# Patient Record
Sex: Male | Born: 1955 | Race: White | Hispanic: No | Marital: Married | Smoking: Never smoker
Health system: Southern US, Community
[De-identification: ages and names within clinical notes are randomized; demographics above are authoritative.]

## PROBLEM LIST (undated history)

## (undated) DIAGNOSIS — R011 Cardiac murmur, unspecified: Secondary | ICD-10-CM

## (undated) DIAGNOSIS — F32A Depression, unspecified: Secondary | ICD-10-CM

## (undated) DIAGNOSIS — F329 Major depressive disorder, single episode, unspecified: Secondary | ICD-10-CM

## (undated) DIAGNOSIS — I341 Nonrheumatic mitral (valve) prolapse: Secondary | ICD-10-CM

## (undated) DIAGNOSIS — F419 Anxiety disorder, unspecified: Secondary | ICD-10-CM

## (undated) DIAGNOSIS — G43909 Migraine, unspecified, not intractable, without status migrainosus: Secondary | ICD-10-CM

## (undated) DIAGNOSIS — A770 Spotted fever due to Rickettsia rickettsii: Secondary | ICD-10-CM

## (undated) HISTORY — DX: Anxiety disorder, unspecified: F41.9

## (undated) HISTORY — PX: HERNIA REPAIR: SHX51

## (undated) HISTORY — PX: VASECTOMY: SHX75

## (undated) HISTORY — DX: Depression, unspecified: F32.A

## (undated) HISTORY — DX: Major depressive disorder, single episode, unspecified: F32.9

## (undated) HISTORY — PX: WRIST SURGERY: SHX841

---

## 2001-10-29 ENCOUNTER — Encounter: Payer: Self-pay | Admitting: Cardiology

## 2001-10-29 ENCOUNTER — Inpatient Hospital Stay (HOSPITAL_COMMUNITY): Admission: AD | Admit: 2001-10-29 | Discharge: 2001-11-01 | Payer: Self-pay | Admitting: Cardiology

## 2007-01-28 ENCOUNTER — Inpatient Hospital Stay (HOSPITAL_COMMUNITY): Admission: AD | Admit: 2007-01-28 | Discharge: 2007-02-01 | Payer: Self-pay | Admitting: Psychiatry

## 2007-01-28 ENCOUNTER — Ambulatory Visit: Payer: Self-pay | Admitting: Psychiatry

## 2007-01-28 ENCOUNTER — Emergency Department (HOSPITAL_COMMUNITY): Admission: EM | Admit: 2007-01-28 | Discharge: 2007-01-28 | Payer: Self-pay | Admitting: Emergency Medicine

## 2007-02-02 ENCOUNTER — Other Ambulatory Visit (HOSPITAL_COMMUNITY): Admission: RE | Admit: 2007-02-02 | Discharge: 2007-02-23 | Payer: Self-pay | Admitting: Psychiatry

## 2007-03-23 ENCOUNTER — Ambulatory Visit (HOSPITAL_COMMUNITY): Payer: Self-pay | Admitting: Psychiatry

## 2007-04-29 ENCOUNTER — Ambulatory Visit (HOSPITAL_COMMUNITY): Payer: Self-pay | Admitting: Psychiatry

## 2007-07-01 ENCOUNTER — Ambulatory Visit (HOSPITAL_COMMUNITY): Payer: Self-pay | Admitting: Psychiatry

## 2011-02-13 NOTE — Discharge Summary (Signed)
NAMEJONCARLO, Leonard Holland                  ACCOUNT NO.:  [id number]   MEDICAL RECORD NO.:  [id number]          PATIENT TYPE:  IPS   LOCATION:  0305                          FACILITY:  [facility]   PHYSICIAN:  Anselm Jungling, MD  DATE OF BIRTH:  05/08/1956   DATE OF ADMISSION:  01/28/2007  DATE OF DISCHARGE:  02/01/2007                               DISCHARGE SUMMARY   IDENTIFYING DATA AND REASON FOR ADMISSION:  This was an inpatient  psychiatric admission for Leonard Holland, a 55 year old married white male.  He had presented to the [facility] Emergency Department the day prior  to admission complaining of persistent headache.  He also reported that  he had been experiencing a lot of stress at work and home, and had left  home 5 days prior.  He described driving to [place], [place] in his  car, and while in [place] in a motel room attempted to end his life  by drinking alcohol and ingesting aspirin.  He woke up the next day,  disappointed that he was still alive, and following this, drove himself  to [place], and claims to have attempted several times to kill myself  there by jumping off cliffs while hiking in the woods.  He stated he  never actually jumped and was unable to get himself to do so.  He then  made one further suicide attempt by ingesting alcohol and aspirin, and  waking up freezing in the woods.  At this point he decided to return  home.  He then called his place of employment, and his Programmer, systems came and brought him back home.  Following his emergency room  evaluation, he was transferred to the inpatient psychiatric service.  Please refer to the admission note for further details pertaining to the  symptoms, circumstances and history that led to his hospitalization.  He  was given initial Axis I diagnoses of mood disorder NOS.   MEDICAL AND LABORATORY:  The patient had a history of mitral valve  prolapse, a history of syncope, chronic headache, which appeared to  be  rebound in nature.  He was continued on his usual aspirin 325 mg daily.  He was also given a brief trial of  Elavil 25 mg at bedtime, for  headache cessation.   HOSPITAL COURSE:  The patient was admitted to the adult inpatient  psychiatric service.  He was initially evaluated by Dr. Lolly Mustache.  Dr.  Lolly Mustache described the patient as an alert and oriented gentleman who was  appropriately groomed, and dressed, and nourished.  His speech and  thought processes were within normal limits.  His mood and affect were  felt to be appropriate to the situation.  He indicated in the initial  interview that he was not sure whether or not he was still suicidal.   The patient was treated with the antidepressant Cymbalta, at a dose of  60 mg daily.  Xanax 0.25 mg was utilized at bedtime to assist with sleep  and anxiety.   On the fourth hospital day, the undersigned assumed care of  the patient.  The patient told me I found out that suicide is not an option.  He  reported that his headache was gone, after he resumed consumption of a  mild amounts of caffeine, by drinking cola.  He stated I feel 100%  better.   We discussed the possibility of a family meeting involving his wife, and  he told me that he did not feel it was necessary.  She visits every  day.  The patient indicated that he felt he was tolerating Cymbalta.  He discussed his aftercare plan of following up with his usual  therapist, Ms. Salinger, following discharge.  He indicated that he had  been in contact with his employer was supportive and who was willing to  make adjustments.   We discussed the possibility of the patient being discharged, with a  plan to continue in the [facility] intensive outpatient program.  On the  following day, the patient appeared to remain stable and appeared  appropriate for step-down to IOP.   AFTERCARE:  The patient was to report to the IOP program on the morning  of Feb 02, 2007.   DISCHARGE  MEDICATIONS:  Aspirin 325 mg daily, and Cymbalta 60 mg daily.  The patient was not recommended to continue on Elavil.   DISCHARGE DIAGNOSES:  AXIS I:  Major depressive disorder, recurrent  without psychotic features.  AXIS II:  Deferred.  AXIS III:  History of mitral valve prolapse.  AXIS IV:  Stressors severe.  AXIS V:  Global assessment of functioning on discharge 55.      Anselm Jungling, MD  Electronically Signed     SPB/MEDQ  D:  02/07/2007  T:  02/08/2007  Job:  [id number]

## 2011-02-13 NOTE — H&P (Signed)
Leonard Holland, SPINK NO.:  [id number]   MEDICAL RECORD NO.:  [id number]          PATIENT TYPE:  IPS   LOCATION:  0305                          FACILITY:  [facility]   PHYSICIAN:  Anselm Jungling, MD  DATE OF BIRTH:  [birth year]   DATE OF ADMISSION:  01/28/2007  DATE OF DISCHARGE:                       PSYCHIATRIC ADMISSION ASSESSMENT   IDENTIFYING INFORMATION:  This is a voluntary admission to the services  of Dr. Geralyn Flash.  This is a 55 year old married white male.  Apparently, he presented to the [facility] ED yesterday complaining of  headache for five days despite taking aspirin q.2h.  He also reported  that he left home five days ago.  He had been experiencing a lot of  stress between his employment and home.  He felt that the only thing he  could do to take care of himself was to just get in his car and drive.  He states he drove straight to [place], [place] and while they were  in a hotel room, he attempted to end life by drinking alcohol and  ingesting aspirin.  He woke up the next day.  He was disappointed he was  still alive.  He then drove straight to [place] and, while there, he  claims to have attempted several times to kill himself by jumping off  the cliffs while hiking in the woods.  He states he never actually  jumped but stood looking down.  After attempting once again by ingesting  alcohol and aspirin and waking up freezing in the woods, he decided to  go back home.  He then called his place of employment and his HR manager  and they came and brought him back home.  He denies being suicidal or  homicidal at present and wants to get rid of his headache.   PAST PSYCHIATRIC HISTORY:  He was treated as an outpatient by Dr.  Marlyne Beards February of 2006 to December of 2007.  Apparently, he has tried  a variety of antidepressant, antianxiety agents.  The one he took the  longest was Wellbutrin.  However, after Dr. Marlyne Beards left private  practice, he did not have his medication renewed.  He also can name  Paxil and Effexor which were not helpful.  He has seen a therapist, a  Dr. Everlene Other, for several years.   SOCIAL HISTORY:  He has three years of collage and an associate's degree  from [employer].  He has been married once.  He has a 62 year old daughter.  This is his ninth year in his current employment.  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   FAMILY HISTORY:  He denies.   ALCOHOL/DRUG HISTORY:  He denies.   PRIMARY CARE PHYSICIAN:  Dr. Gordy Savers.   MEDICAL PROBLEMS:  He does have mitral valve prolapse.  He had a workup  in February of 2003 for syncope.  At that time, he did have an abnormal  EKG but it was nonspecific.  He also has chronic headache and is status  post  repair of a right inguinal hernia.   MEDICATIONS:  He was prescribed Xanax 0.25 mg, to take at h.s. by Dr.  Marlyne Beards.  He just takes this p.r.n. and hence that explains why he  still has some.   ALLERGIES:  PENICILLIN (he reports having had a seizure).   POSITIVE PHYSICAL FINDINGS:  He was medically cleared in the ED at  [facility].  He had no remarkable physical findings other than his UDS  being positive for benzodiazepines.  His vital signs show he is 67  inches tall.  He weighs 175 pounds.  Temperature is 97.3, blood pressure  125/73 to 167/75, pulse 80-91, respirations 18.  He does have an old  well-healed right inguinal hernia scar and he is reporting tinnitus.  Given the amount of aspirin he has been taking, however, that would not  be uncommon.   MENTAL STATUS EXAM:  Today, he is alert and oriented x3.  He is  appropriately groomed, dressed and nourished.  His speech is a normal  rate, rhythm and tone.  His mood is appropriate to the situation.  His  affect has a normal range.  His thought processes are somewhat focused  on himself and narcissistic.  Judgment and insight are intact.   Concentration and memory are intact.  Intelligence is at least average.  He is not sure that he is still suicidal.  He is definitely not  homicidal and he does not have auditory or visual hallucinations.   DIAGNOSES:  AXIS I:  Mood disorder not otherwise specified.  AXIS II:  Deferred.  AXIS III:  Mitral valve prolapse, history for syncope, chronic headache,  currently rebound in nature.  AXIS IV:  Moderate.  AXIS V:  34.   PLAN:  To admit for safety and stabilization.  To start and appropriate  antidepressant, anti-anxiety agent.  Toward that end, we will start  Cymbalta.  We will also give him some Elavil 25 mg at h.s. to help with  his chronic headache, etc.   ESTIMATED LENGTH OF STAY:  Four to five days.  We will have to help line  up an outside psychiatrist for him.      Mickie Leonarda Salon, P.A.-C.      Anselm Jungling, MD  Electronically Signed    MD/MEDQ  D:  01/29/2007  T:  01/29/2007  Job:  [id number]

## 2011-02-13 NOTE — Cardiovascular Report (Signed)
[place]  Patient:    Leonard Holland, Leonard Holland Visit Number: [id number] MRN: [id number]          Service Type: MED Location: 2000 2005 01 Attending Physician:  Pamella Pert Dictated by:   Lenise Herald, M.D. Proc. Date: 10/31/01 Admit Date:  10/29/2001   CC:         Delrae Rend, M.D.  Aura Dials, M.D.   Cardiac Catheterization  PROCEDURE:  Heads up tilt table testing.  CARDIOLOGIST:  Lenise Herald, M.D.  COMPLICATIONS:  None.  INDICATIONS:  Mr. Gorniak is a 55 year old male, patient of Dr. Jeanella Cara and Dr. Everlene Other with a history of recurrent syncope.  The patient did have a true syncopal episode with right facial trauma.  He is now referred for tilt table testing to rule out autonomic dysfunction.  DESCRIPTION OF PROCEDURE:  After obtaining informed consent, the patient was brought to the catheterization lab in a fasting state.  The patient was monitored in a supine position with a resting blood pressure of 130/79, resting heart rate 71.  The patient was then tilted to 7 degrees heads up position and was monitored in a hemodynamic fashion.  At approximately 9 minutes into the procedure, the patient became diaphoretic and felt slightly short of breath.  His blood pressure dropped to 60/palpable with a heart rate in the mid 60s.  The patient did have complete loss of consciousness with palpable blood pressure of 58.  He was again returned to a supine position with return of his blood pressure to 130/71 and complete return of consciousness.  He awoke in stable condition.  He had no significant arrhythmias.  He was continued to be monitored for approximately 10 minutes following the event and remained hemodynamically stable.  CONCLUSIONS:  Positive heads up tilt table testing. Dictated by:   Lenise Herald, M.D. Attending Physician:  Pamella Pert DD:  10/31/01 TD:  11/01/01 Job: [id number]

## 2011-02-13 NOTE — Discharge Summary (Signed)
. [facility]  Patient:    Leonard Holland, Leonard Holland Visit Number: [id number] MRN: [id number]          Service Type: MED Location: 2000 2005 01 Attending Physician:  Pamella Pert Dictated by:   Raymon Mutton, P.A. Admit Date:  10/29/2001 Discharge Date: 11/01/2001                             Discharge Summary  DATE OF BIRTH:  12/08/2055  DISCHARGE DIAGNOSES: 1. Recurrent syncopal episodes. 2. Facial trauma secondary to syncope. 3. Abnormal EKG with nonspecific ST-T wave changes. 4. Mitral valve prolapse by physical examination. 5. 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  HISTORY OF PRESENT ILLNESS:  The patient is a 55 year old gentleman who was admitted to [facility] after the syncopal episodes associated with full and significant facial trauma.  He was transferred to the telemetry unit, and scheduled for head up tilt table testing, and was placed in observation with serial cardiac enzymes.  The patient presented with no complaints of chest pain or shortness of breath or palpitations.  He just reported that this was the second episode of syncope within a two week period.  LABORATORY DATA:  Cardiac enzymes revealed first set CK of 245, MB 3.4, and troponin-I 0.01.  The second set of enzymes showed total ______ 188, CK-MB 2.4, and troponin-I also was not remarkable.  His sodium was 143, potassium 4.2, glucose 96, BUN 10, and creatinine 1.1. Liver function tests were within normal limits.  Lipid profile showed cholesterol 107, triglycerides 39, HDL 59, and LDL 40.  Thyroid stimulating hormone was 0.928.  EKG revealed nonspecific ST-T wave changes.  HOSPITAL COURSE:  The patient underwent head up table tilting test performed by Dr. Jenne Campus on October 31, 2001, because of his recurrent syncope.  It was positive for orthostatic drop of blood pressure.  The patient developed diaphoresis, became slightly short of  breath, and did have a complete loss of consciousness with palpable blood pressure of 58.  After returning to a supine position, blood pressure was restored.  He awoke in stable condition, and had no significant arrhythmias.  The patient was sent home on November 01, 2001, in stable condition.  DISCHARGE MEDICATIONS: 1. Aspirin 325 mg one p.o. q.d. 2. Toprol XL 25 mg one p.o. q.d. 3. Zoloft 25 mg one p.o. q.d.  ACTIVITY:  No driving until further advised.  He was allowed to return to work in two days.  DIET:  Low fat, low cholesterol.  FOLLOWUP: 1. He was scheduled for outpatient echocardiogram in our office on November 14, 2001, at 9 a.m. 2. He is going to be seen by Dr. Jacinto Halim in the office on November 16, 2001, at    9 a.m. Dictated by:   Raymon Mutton, P.A. Attending Physician:  Pamella Pert DD:  11/01/01 TD:  11/02/01 Job: [id number]

## 2011-02-13 NOTE — H&P (Signed)
[place]  Patient:    Leonard Holland, Leonard Holland Visit Number: [id number] MRN: [id number]          Service Type: MED Location: 2000 2005 01 Attending Physician:  Pamella Pert Dictated by:   Delrae Rend, M.D. Admit Date:  10/29/2001   CC:         Gordy Savers, M.D. [facility]             Aura Dials, M.D.             [facility], [place], [place]                         History and Physical  CHIEF COMPLAINT: Syncope.  IMPRESSION:  1. Syncope two weeks ago and again around 4:30 a.m. today.  This was     associated with significant trauma to the face with bruising on the right     side of the face.  2. Abnormal electrocardiogram.  There is now nonspecific T wave inversion     noted in the anterior chest leads which was not evident on the     electrocardiogram done at Urgent Care.  Electrocardiogram done by Dr.     Everlene Other showed normal sinus rhythm, normal axis, with no evidence of any     ischemia; however, the present electrocardiogram shows nonspecific T wave     inversion.  However, cannot exclude ischemic.  3. History of mitral valve prolapse and presently does have mitral valve     click followed by late mitral valve murmur.  4. 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  He     has had cardiac catheterization when he was a child.  RECOMMENDATIONS:  1. The patient will be admitted to telemetry and his rhythm will be followed.     The etiology of syncope at this time is unknown.  There are no orthostatic     changes on blood pressure.  I suspect that this could be vasovagal.  This     initially started with severe leg cramps with both episodes, two weeks ago     and this morning, followed by the patient going to the bathroom to pass     urine and comes out and previously almost passed out but this morning     fell right on his face and injured the right side of his face.  This may     be  vasovagal episode; however, I cannot exclude cardiac event or     myocardial ischemia leading to his syncope.  2. Will obtain CPKs and troponin.  Will also check TSH.  3. Will check CT of the head to evaluate for any evidence of ventricular     bleed or cerebrovascular accident.  4. Will obtain an echocardiogram to evaluate mitral valve prolapse and also     to evaluate for evidence of atrioseptal defect and any structural     abnormality.  Further recommendations to follow following the availability of these tests.  HISTORY OF PRESENT ILLNESS: Mr. Goldring is a 55 year old active white male, who was doing well until two weeks ago when in the wee hours of the morning woke up with right leg cramping sensation.  He got up from the bed to stretch his legs and felt slightly better.  He went to the bathroom and passed urine, came back, and while coming back he almost passed out  but he was able to hold onto the bed and he felt dizzy and mildly diaphoretic.  However, he went to bed and he felt better when he woke up.  Again this morning around 4 a.m. he had similar episode of right-sided right leg cramping, followed by the patient getting up to stretch his legs and when he went to the bathroom and came out, then the next thing he remembers is waking up on the ground.  His wife was trying to wake him up.  There was no incontinence.  There was no seizure or abnormal movement noted by his wife at that time.  It looks like he passed out only for like a minute.  He woke up and was diaphoretic at that time but no history of any confusion following syncope.  Otherwise, he felt better.  He slept and woke up around 7:30 a.m.  He noted he was bleeding from his nose and his right face.  The right side of the face was bruised.  Hence, he went to Urgent Care.  At this time he denies any chest pain, denies any shortness of breath.  He has no history of palpitations, paroxysmal nocturnal dyspnea, or  orthopnea.  REVIEW OF SYSTEMS: He denies any bowel or bladder dysfunction.  He denies any neurological weakness in the form of transient loss of vision or weakness in the extremities.  There is no history of any recent weight gain or weight loss.  There is no history of palpitations.  There is no history of syncope. The patient, of note, was told that his cramps were related to hypokalemia. He was advised to eat one banana a day.  Last episode of cramping was many years ago.  FAMILY HISTORY: No history of premature coronary artery disease in the family. His father died at the age of 70 with congestive heart failure and stroke.  He was a heavy smoker.  His mother died at the age of 84 of noncardiac problems. He has two sisters who are older than him, who are alive and healthy.  He had one brother who died at the age of 73, he was electrocuted.  There is no history of any sudden cardiac death in his family, either first degree or second degree relatives.  SOCIAL HISTORY: He is married, has a child.  Lives with his wife.  He does not smoke.  He does not drink alcohol.  No history of illicit drug abuse.  He presently works in the Education administrator.  MEDICATIONS: None.  ALLERGIES: No known drug allergies. Questionable to PENICILLIN; however, he does take what appears to be ampicillin for his mitral valve prolapse during dental procedures.  PAST MEDICAL HISTORY: No history of hypertension.  No history of diabetes.  No history of hypercholesterolemia in the past; however, he does not know his numbers.  PAST SURGICAL HISTORY: None.  PHYSICAL EXAMINATION:  GENERAL: He is well-developed, well-nourished.  He appears to be in no acute distress.  VITAL SIGNS: Heart rate 82 beats per minute.  Respirations 12.  Blood pressure lying down is 122/72, on standing up is 121/83 mm Hg.  No symptoms of dizziness on standing.  CARDIAC: S1 and S2 normal.  There is an early systolic click followed  by a late systolic murmur in the mitral area.  There is no appreciation of  widespread S2.  CHEST: Bilaterally equal breath sounds.  No crackles.  ABDOMEN: Benign.  Bowel sounds heard in all four quadrants.  EXTREMITIES: No edema.  Peripheral vascular examination showed all pulses to be 2+ and equal.  No carotid bruit, no abdominal and no femoral artery bruit heard.  NEUROLOGIC: In the right face, including his periorbital area and the nasal bridge, has ecchymotic changes.  LABORATORY DATA: ECG at Dr. Myrlene Broker office demonstrated normal sinus rhythm, normal axis early this morning around 10:45 a.m.  ECG performed here at [facility] at 1535 hours reveals normal sinus rhythm, incomplete right bundle branch block, and new nonspecific T wave changes in the anterior chest leads.  Electrolytes showed a sodium of 138, potassium 3.3, chloride 108, bicarbonate 29, BUN 12, creatinine 1.1.  CBC was within normal limits, hemoglobin 14.9. CPK total 268, MB 4.9, index 1.8.  Troponin less than 0.01. Dictated by:   Delrae Rend, M.D. Attending Physician:  Pamella Pert DD:  10/29/01 TD:  10/30/01 Job: [id number]

## 2012-05-18 ENCOUNTER — Emergency Department (HOSPITAL_COMMUNITY): Payer: BC Managed Care – PPO

## 2012-05-18 ENCOUNTER — Emergency Department (HOSPITAL_COMMUNITY)
Admission: EM | Admit: 2012-05-18 | Discharge: 2012-05-18 | Disposition: A | Discharge status: Home / Self Care | Payer: BC Managed Care – PPO | Attending: Emergency Medicine | Admitting: Emergency Medicine

## 2012-05-18 ENCOUNTER — Encounter (HOSPITAL_COMMUNITY): Payer: Self-pay | Admitting: *Deleted

## 2012-05-18 DIAGNOSIS — R079 Chest pain, unspecified: Secondary | ICD-10-CM | POA: Insufficient documentation

## 2012-05-18 DIAGNOSIS — R0602 Shortness of breath: Secondary | ICD-10-CM | POA: Insufficient documentation

## 2012-05-18 DIAGNOSIS — R61 Generalized hyperhidrosis: Secondary | ICD-10-CM | POA: Insufficient documentation

## 2012-05-18 HISTORY — DX: Cardiac murmur, unspecified: R01.1

## 2012-05-18 HISTORY — DX: Migraine, unspecified, not intractable, without status migrainosus: G43.909

## 2012-05-18 LAB — CBC WITH DIFFERENTIAL/PLATELET
Eosinophils Absolute: 0.2 10*3/uL (ref 0.0–0.7)
Eosinophils Relative: 3 % (ref 0–5)
HCT: 48.1 % (ref 39.0–52.0)
Hemoglobin: 16.7 g/dL (ref 13.0–17.0)
Lymphs Abs: 1.9 10*3/uL (ref 0.7–4.0)
MCH: 27 pg (ref 26.0–34.0)
MCV: 77.7 fL — ABNORMAL LOW (ref 78.0–100.0)
Monocytes Relative: 9 % (ref 3–12)
RBC: 6.19 MIL/uL — ABNORMAL HIGH (ref 4.22–5.81)

## 2012-05-18 LAB — COMPREHENSIVE METABOLIC PANEL
Alkaline Phosphatase: 60 U/L (ref 39–117)
BUN: 13 mg/dL (ref 6–23)
Calcium: 9.7 mg/dL (ref 8.4–10.5)
GFR calc Af Amer: 82 mL/min — ABNORMAL LOW (ref >90)
GFR calc non Af Amer: 70 mL/min — ABNORMAL LOW (ref >90)
Glucose, Bld: 101 mg/dL — ABNORMAL HIGH (ref 70–99)
Total Protein: 6.9 g/dL (ref 6.0–8.3)

## 2012-05-18 LAB — PROTIME-INR: Prothrombin Time: 12.3 seconds (ref 11.6–15.2)

## 2012-05-18 LAB — APTT: aPTT: 35 seconds (ref 24–37)

## 2012-05-18 LAB — POCT I-STAT TROPONIN I: Troponin i, poc: 0 ng/mL (ref 0.00–0.08)

## 2012-05-18 MED ORDER — ASPIRIN 81 MG PO CHEW
CHEWABLE_TABLET | ORAL | Status: AC
  Administered 2012-05-18: 324 mg
  Filled 2012-05-18: qty 4

## 2012-05-18 MED ORDER — KETOROLAC TROMETHAMINE 30 MG/ML IJ SOLN
30.0000 mg | Freq: Once | INTRAMUSCULAR | Status: AC
  Administered 2012-05-18: 30 mg via INTRAVENOUS
  Filled 2012-05-18: qty 1

## 2012-05-18 MED ORDER — NITROGLYCERIN 0.4 MG SL SUBL
0.4000 mg | SUBLINGUAL_TABLET | SUBLINGUAL | Status: AC | PRN
  Administered 2012-05-18 (×3): 0.4 mg via SUBLINGUAL
  Filled 2012-05-18: qty 75

## 2012-05-18 MED ORDER — DIAZEPAM 5 MG PO TABS: 5.0000 mg | ORAL_TABLET | ORAL | Status: AC | PRN

## 2012-05-18 MED ORDER — IBUPROFEN 600 MG PO TABS: 600.0000 mg | ORAL_TABLET | Freq: Four times a day (QID) | ORAL | Status: AC | PRN

## 2012-05-18 MED ORDER — LORAZEPAM 2 MG/ML IJ SOLN
1.0000 mg | Freq: Once | INTRAMUSCULAR | Status: AC
  Administered 2012-05-18: 1 mg via INTRAVENOUS
  Filled 2012-05-18: qty 1

## 2012-05-18 MED ORDER — IOHEXOL 350 MG/ML SOLN
100.0000 mL | Freq: Once | INTRAVENOUS | Status: AC | PRN
  Administered 2012-05-18: 100 mL via INTRAVENOUS

## 2012-05-18 NOTE — ED Notes (Signed)
Allen, MD at bedside

## 2012-05-18 NOTE — ED Notes (Addendum)
Patient transported to CT 

## 2012-05-18 NOTE — ED Notes (Signed)
EKG given to Dr. Allen 

## 2012-05-18 NOTE — ED Notes (Signed)
Patient transported to CT 

## 2012-05-18 NOTE — ED Provider Notes (Signed)
History     CSN: [id number]  Arrival date & time 05/18/12  4098   First MD Initiated Contact with Patient 05/18/12 [phone]      Chief Complaint  Patient presents with  . Chest Pain    (Consider location/radiation/quality/duration/timing/severity/associated sxs/prior treatment) Patient is a 56 y.o. male presenting with chest pain. The history is provided by the patient and the spouse.  Chest Pain    patient complain of chest pain which has been persistent all day and localized to his left rib area. Notes increased dyspnea with some diaphoresis. Pain is exertional and better with rest. No prior history of same. Denies any recent fever or cough. Pain is nonpleuritic. No leg pain or swelling. No prior history of same. No medications used prior to arrival. Does note increased tiredness and weakness  Past Medical History  Diagnosis Date  . Heart murmur   . Migraines     History reviewed. No pertinent past surgical history.  No family history on file.  History  Substance Use Topics  . Smoking status: Not on file  . Smokeless tobacco: Not on file  . Alcohol Use: Yes      Review of Systems  Cardiovascular: Positive for chest pain.  All other systems reviewed and are negative.    Allergies  Penicillins  Home Medications  No current outpatient prescriptions on file.  BP 126/78  Pulse 89  Temp 98.3 F (36.8 C) (Oral)  Resp 14  Wt 176 lb (79.833 kg)  SpO2 98%  Physical Exam  Nursing note and vitals reviewed. Constitutional: He is oriented to person, place, and time. He appears well-developed and well-nourished.  Non-toxic appearance. No distress.  HENT:  Head: Normocephalic and atraumatic.  Eyes: Conjunctivae, EOM and lids are normal. Pupils are equal, round, and reactive to light.  Neck: Normal range of motion. Neck supple. No tracheal deviation present. No mass present.  Cardiovascular: Normal rate, regular rhythm and normal heart sounds.  Exam reveals no gallop.    No murmur heard. Pulmonary/Chest: Effort normal and breath sounds normal. No stridor. No respiratory distress. He has no decreased breath sounds. He has no wheezes. He has no rhonchi. He has no rales.  Abdominal: Soft. Normal appearance and bowel sounds are normal. He exhibits no distension. There is no tenderness. There is no rebound and no CVA tenderness.  Musculoskeletal: Normal range of motion. He exhibits no edema and no tenderness.  Neurological: He is alert and oriented to person, place, and time. He has normal strength. No cranial nerve deficit or sensory deficit. GCS eye subscore is 4. GCS verbal subscore is 5. GCS motor subscore is 6.  Skin: Skin is warm and dry. No abrasion and no rash noted.  Psychiatric: He has a normal mood and affect. His speech is normal and behavior is normal.    ED Course  Procedures (including critical care time)  Labs Reviewed - No data to display No results found.   No diagnosis found.    MDM   Date: 05/18/2012  Rate: 82  Rhythm: normal sinus rhythm  QRS Axis: normal  Intervals: normal  ST/T Wave abnormalities: normal  Conduction Disutrbances:none  Narrative Interpretation:   Old EKG Reviewed: unchanged   4:51 AM Patient given Toradol and his pain is improved. He was given nitroglycerin which did not help the symptoms. Patient's symptoms have been constant for over 24 hours with negative cardiac enzymes. No evidence of PE based on negative chest CT and cardiac troponin and EKG  are both normal. Suspect musculoskeletal etiology of his symptoms and patient to be discharged       Toy Baker, MD 05/18/12 [phone]

## 2012-06-04 ENCOUNTER — Ambulatory Visit (INDEPENDENT_AMBULATORY_CARE_PROVIDER_SITE_OTHER): Payer: BC Managed Care – PPO | Admitting: Family Medicine

## 2012-06-04 VITALS — BP 118/88 | HR 83 | Temp 98.3°F | Resp 16 | Ht 68.0 in | Wt 186.8 lb

## 2012-06-04 DIAGNOSIS — Z7184 Encounter for health counseling related to travel: Secondary | ICD-10-CM

## 2012-06-04 DIAGNOSIS — Z7189 Other specified counseling: Secondary | ICD-10-CM

## 2012-06-04 DIAGNOSIS — R252 Cramp and spasm: Secondary | ICD-10-CM

## 2012-06-04 LAB — POCT CBC
Granulocyte percent: 70.1 %G (ref 37–80)
HCT, POC: 51.1 % (ref 43.5–53.7)
MPV: 11.2 fL (ref 0–99.8)
POC Granulocyte: 3.6 (ref 2–6.9)
POC LYMPH PERCENT: 21.8 %L (ref 10–50)
POC MID %: 8.1 %M (ref 0–12)
Platelet Count, POC: 314 10*3/uL (ref 142–424)
RDW, POC: 14.3 %

## 2012-06-04 LAB — COMPREHENSIVE METABOLIC PANEL
ALT: 34 U/L (ref 0–53)
AST: 20 U/L (ref 0–37)
Alkaline Phosphatase: 57 U/L (ref 39–117)
BUN: 16 mg/dL (ref 6–23)
Chloride: 108 mEq/L (ref 96–112)
Creat: 1.19 mg/dL (ref 0.50–1.35)
Total Bilirubin: 0.6 mg/dL (ref 0.3–1.2)

## 2012-06-04 LAB — CK: Total CK: 200 U/L (ref 7–232)

## 2012-06-04 LAB — POCT SEDIMENTATION RATE: POCT SED RATE: 8 mm/hr (ref 0–22)

## 2012-06-04 MED ORDER — CIPROFLOXACIN HCL 500 MG PO TABS: ORAL_TABLET | ORAL | Status: DC

## 2012-06-04 MED ORDER — CYCLOBENZAPRINE HCL 10 MG PO TABS: ORAL_TABLET | ORAL | Status: DC

## 2012-06-04 MED ORDER — MELOXICAM 15 MG PO TABS: ORAL_TABLET | ORAL | Status: DC

## 2012-06-04 NOTE — Progress Notes (Signed)
Subjective: 56 year old man with history of frequent nighttime posterior thigh and leg cramps. He works primarily at [employer] job, but does do a fair amount of exercise. He's been doing some digging in his yard recently. He usually gets cramps at night, though occasionally he can get them when he just sitting around relaxing. He has been having problems with cramps off and on his life. Some years ago he was told to take more potassium. Recently he has had maybe 5 episodes at night per week for the last 2 weeks. He has to get up and stretch himself out. Does not feel like he's been under any excessive stress. He is getting regular dental [place] for a week on a business trip, and is a licensed because of these previous time when he got a bad diarrheal episode. He is not on any regular medications. He is generally healthy. He has not any major back problems.  Objective: Abdomen soft. Straight leg raising test essentially negative there is a little bit tight in his posterior thighs. Femoral and pedal pulses are normal. Deep tendon reflexes are brisk and symmetrical. No gross atrophy or abnormality of the muscles could be noted.  Assessment: Leg cramps Anticipated foreign travel  Plan: Check CBC, CMET, CPK, and sedimentation rate. In looking at his old chart I see that at one time he did have a CPK which is a little elevated, and but then repeat was normal.  All labs are pending will probably treat with some muscle relaxant and anti-inflammatory medications at bedtime to see if that will help.  Offer prophylactic medication for his treatment in [place].  Results for orders placed in visit on 06/04/12  POCT CBC      Component Value Range   WBC 5.2  4.6 - 10.2 K/uL   Lymph, poc 1.1  0.6 - 3.4   POC LYMPH PERCENT 21.8  10 - 50 %L   MID (cbc) 0.4  0 - 0.9   POC MID % 8.1  0 - 12 %M   POC Granulocyte 3.6  2 - 6.9   Granulocyte percent 70.1  37 - 80 %G   RBC 6.05  4.69 - 6.13 M/uL   Hemoglobin 16.4   14.1 - 18.1 g/dL   HCT, POC 78.2  95.6 - 53.7 %   MCV 84.5  80 - 97 fL   MCH, POC 27.1  27 - 31.2 pg   MCHC 32.1  31.8 - 35.4 g/dL   RDW, POC 21.3     Platelet Count, POC 314  142 - 424 K/uL   MPV 11.2  0 - 99.8 fL

## 2012-06-04 NOTE — Patient Instructions (Addendum)
Leg Cramps Leg cramps that occur during exercise can be caused by poor circulation or dehydration. However, muscle cramps that occur at rest or during the night are usually not due to any serious medical problem. Heat cramps may cause muscle spasms during hot weather.  CAUSES There is no clear cause for muscle cramps. However, dehydration may be a factor for those who do not drink enough fluids and those who exercise in the heat. Imbalances in the level of sodium, potassium, calcium or magnesium in the muscle tissue may also be a factor. Some medications, such as water pills (diuretics), may cause loss of chemicals that the body needs (like sodium and potassium) and cause muscle cramps. TREATMENT   Make sure your diet has enough fluids and essential minerals for the muscle to work normally.   Avoid strenuous exercise for several days if you have been having frequent leg cramps.   Stretch and massage the cramped muscle for several minutes.   Some medicines may be helpful in some patients with night cramps. Only take over-the-counter or prescription medicines as directed by your caregiver.  SEEK IMMEDIATE MEDICAL CARE IF:   Your leg cramps become worse.   Your foot becomes cold, numb, or blue.  Document Released: 10/22/2004 Document Revised: 09/03/2011 Document Reviewed: 10/09/2008 [facility] Patient Information 2012 [place], [place].   Travelers' Diarrhea Travelers' diarrhea (TD) is the most common illness affecting travelers. Each year many travelers develop diarrhea. TD usually occurs within the first week of travel. However, it may occur at any time while traveling. It may even occur after returning home. The most important risk factor is where you are going. High-risk places are the developing countries of:  [place].   [place].   [place].   [place].  High risk people include young adults and those with:  Transplants.   HIV infections.   Medicine that suppresses the  immune system.   Inflammatory-bowel disease.   Diabetes.   H-2 blockers or antacids.  Attack rates are similar for men and women. The primary source of TD is eating or drinking food or water tainted with feces (stool or bowel movements). CAUSES  Infectious agents are the primary cause of TD. Germs cause almost 80% of TD cases. The most common germ produces:  Watery diarrhea with cramps.   Low-grade or no fever.  There are many other bacterial, viral and parasitic pathogens (disease causing "bugs").  SYMPTOMS  Most TD cases begin suddenly. Symptoms include stool that is increased in:  Frequency.   Volume.   Weight.  Altered stool consistency also is common. Typically, you have four to five loose or watery bowel movements each day. Other common symptoms are:  Nausea.   Vomiting.   Diarrhea.   Abdominal cramping.   Bloating.   Fever.   Urgency.   Malaise.  Most cases are not dangerous. Most cases go away in 1-2 days without treatment. TD is rarely life threatening. 90% of cases resolve within 1 week. 98% resolve within 1 month. PREVENTION   Avoid foods or beverages purchased from street vendors in high risk countries.   Avoid food from places where unclean conditions are present.   Avoid raw or undercooked meat and seafood.   Avoid raw fruits (e.g., oranges, bananas, avocados) and vegetables unless you peel them yourself.   If handled properly, well-cooked and packaged foods usually are safe. Foods associated with increased risk for TD include:   Tap water.   Ice.   Unpasteurized milk.  Dairy products.   Safe beverages include:   Bottled carbonated beverages.   Hot tea or coffee.   Beer.   Wine.   Boiled water.   Water treated with iodine or chlorine.  ANTIBIOTICS ARE NOT RECOMMENDED AS PREVENTION  CDC (Centers for Disease Control) does not recommend antimicrobial drugs (medicine that kill germs) to prevent TD. Several studies show that  Pepto-Bismol taken as either 2 tablets 4 times daily, or 2 fluid ounces 4 times daily, reduces the incidence of travelers' diarrhea. People that should avoid Pepto-Bismol include those who are:   Pregnant.   Allergic to aspirin.   Taking anticoagulants medicine (probenecid, methotrexate).   Be informed about potential side effects, in particular about temporary blackening of the tongue and stool, and rarely ringing in the ears. Because of potential adverse side effects, preventative Pepto-Bismol should not be used for more than 3 weeks.   Some antibiotics taken in a once-a-day dose are 90% effective at preventing travelers' diarrhea. However, antibiotics are not recommended as prevention. Routine antimicrobial prophylaxis increases your risk for:   Adverse reactions.   Infections with resistant organisms.   Antibiotics can increase your susceptibility to resistant bacterial pathogens and provide no protection against either viral or parasitic pathogens. This can give travelers a false sense of security. As a result, strict adherence to preventive measures is encouraged. Pepto-Bismol should be used as an extra effort if prophylaxis is needed.  TREATMENT   TD usually is a self-limited disorder. It gets well without treatment. It often goes away without specific treatment. Oral re-hydration is often helpful to replace lost fluids and electrolytes. Clear liquids are routinely recommended for adults. You may be helped with antimicrobial therapy if you develop three or more loose stools in an 8-hour period, especially if associated with:   Nausea.   Vomiting.   Abdominal cramps.   Fever.   Blood in stools.   Antibiotics usually are given for 3-5 days. Pepto-Bismol also may be used as treatment. Take one fluid ounce, or two 262 mg tablets every 30 minutes, for up to 8 doses in a 24-hour period. This can be repeated on a second day. If diarrhea persists despite therapy, you should be  evaluated by a caregiver and treated for possible parasitic infection.   Because drug resistance is a continuing problem and may vary from country to country, professional assistance should be looked for if problems persist.   Antimotility agents (loperamide, diphenoxylate, and paregoric) mostly reduce diarrhea by slowing down the passage of food and drink in the gut. This allows more time for absorption. Some persons believe diarrhea is the body's defense mechanism to minimize contact time between gut pathogens and lining of the bowel. In several studies, antimotility agents have been useful in treating travelers' diarrhea by decreasing the duration of diarrhea. However, these agents should never be used by persons with fever or bloody diarrhea because they can increase the severity of disease by delaying clearance of causative organisms. Because antimotility agents are now available over the counter, their improper use is of concern. Complications have been reported from the use of these medicines such as:   Toxic megacolon.   Sepsis.   Disseminated intravascular coagulation.  SEEK IMMEDIATE MEDICAL CARE IF:   You are unable to keep fluids down.   Vomiting or diarrhea becomes persistent.   Abdominal (belly) pain develops or increases or localizes. (Right sided pain can be appendicitis and left sided pain in adults can be diverticulitis).   You  develop an oral temperature above 102 F (38.9 C), or as your caregiver suggests.   Diarrhea becomes excessive or contains blood or mucous.   Excessive weakness, dizziness, fainting or extreme thirst.   Checking weight 2 to 3 times per day in babies and children will help verify adequate fluid replacement. Your caregiver will tell you what loss should concern you or suggest another visit to your personal physician.   Record your weight or your child's weight today. Compare this to your home scale and record all weights and time and date weighed.  Try to check weight at the same times every day. Bring this chart to your caregivers if you or your child needs to be seen again.  FOR MORE INFORMATION  Travelers should consult with a caregiver before departing on a trip abroad. Information about TD is available from:  Your local or state health departments.   World [employer] writer [facility]).  Other information that may be of interest to travelers can be found at the [facility] homepage at [email or web address]. Document Released: 09/04/2002 Document Revised: 09/03/2011 Document Reviewed: 11/22/2008 [facility] Patient Information 2012 [place], [place].

## 2012-12-27 ENCOUNTER — Ambulatory Visit (INDEPENDENT_AMBULATORY_CARE_PROVIDER_SITE_OTHER): Payer: BC Managed Care – PPO | Admitting: Internal Medicine

## 2012-12-27 VITALS — BP 108/74 | HR 88 | Temp 97.9°F | Resp 18 | Ht 68.0 in | Wt 188.0 lb

## 2012-12-27 DIAGNOSIS — J029 Acute pharyngitis, unspecified: Secondary | ICD-10-CM

## 2012-12-27 DIAGNOSIS — R110 Nausea: Secondary | ICD-10-CM

## 2012-12-27 DIAGNOSIS — R197 Diarrhea, unspecified: Secondary | ICD-10-CM

## 2012-12-27 DIAGNOSIS — R509 Fever, unspecified: Secondary | ICD-10-CM

## 2012-12-27 LAB — POCT UA - MICROSCOPIC ONLY
Bacteria, U Microscopic: NEGATIVE
Mucus, UA: POSITIVE

## 2012-12-27 LAB — POCT URINALYSIS DIPSTICK
Bilirubin, UA: NEGATIVE
Ketones, UA: NEGATIVE
Leukocytes, UA: NEGATIVE
Spec Grav, UA: 1.025
pH, UA: 5

## 2012-12-27 LAB — POCT CBC
Granulocyte percent: 69.1 %G (ref 37–80)
Lymph, poc: 1.3 (ref 0.6–3.4)
MCHC: 32.7 g/dL (ref 31.8–35.4)
MID (cbc): 0.5 (ref 0–0.9)
POC Granulocyte: 4.1 (ref 2–6.9)
POC LYMPH PERCENT: 22.5 %L (ref 10–50)
POC MID %: 8.4 %M (ref 0–12)
Platelet Count, POC: 323 10*3/uL (ref 142–424)
RDW, POC: 14.1 %

## 2012-12-27 MED ORDER — ONDANSETRON HCL 8 MG PO TABS: 8.0000 mg | ORAL_TABLET | Freq: Three times a day (TID) | ORAL | Status: DC | PRN

## 2012-12-27 MED ORDER — DOXYCYCLINE HYCLATE 100 MG PO TABS: 100.0000 mg | ORAL_TABLET | Freq: Two times a day (BID) | ORAL | Status: DC

## 2012-12-27 MED ORDER — ONDANSETRON 4 MG PO TBDP
8.0000 mg | ORAL_TABLET | Freq: Once | ORAL | Status: AC
  Administered 2012-12-27: 8 mg via ORAL

## 2012-12-27 NOTE — Patient Instructions (Signed)
[place] Spotted Fever [place] Sp[place] Fever (RMSF) is the oldest known tick-borne disease of people in the [place]. This disease was named because it was first described among people in the [place] area who had an illness characterized by a rash with red-purple-black spots. This disease is caused by a rickettsia (Rickettsia rickettsii), a bacteria carried by the tick. The [place] wood tick and the [place] dog tick, acquire and transmit the RMSF bacteria (pictures NOT actual size). When a larval, nymphal or adult tick feeds on an infected rodent or larger animal, the tick can become infected. Infected adult ticks then feed on people who may then get RMSF. The tick transmits the disease to humans during a prolonged period of feeding that lasts many hours, days or even a couple weeks. The bite is painless and frequently goes unnoticed. An infected male tick may also pass the rickettsial bacteria to her eggs that then may mature to be infected adult ticks. The rickettsia that causes RMSF can also get into a person's body through damaged skin. A tick bite is not necessary. People can get RMSF if they crush a tick and get it's blood or body fluids on their skin through a small cut or sore.  DIAGNOSIS Diagnosis is made by laboratory tests.  TREATMENT Treatment is with antibiotics (medications that kill rickettsia and other bacteria). Immediate treatment usually prevents death. GEOGRAPHIC RANGE This disease was reported only in the [place] until 1931. RMSF has more recently been described among individuals in all states except [place], [place] and [place]. The highest reported incidences of RMSF now occur among residents of [place], [place], [place] and the [place]. TIME OF YEAR  Most cases are diagnosed during late spring and summer when ticks are most active. However, especially in the warmer southern states, a few cases occur during the winter. SYMPTOMS    Symptoms of RMSF begin from 2 to 14 days after a tick bite. The most common early symptoms are fever, muscle aches and headache followed by nausea (feeling sick to your stomach) or vomiting.  The RMSF rash is typically delayed until 3 or more days after symptom onset, and eventually develops in 9 of 10 infected patients by the 5th day of illness. If the disease is not treated it can cause death. If you get a fever, headache, muscle aches, rash, nausea or vomiting within 2 weeks of a possible tick bite or exposure you should see your caregiver immediately. PREVENTION Ticks prefer to hide in shady, moist ground litter. They can often be found above the ground clinging to tall grass, brush, shrubs and low tree branches. They also inhabit lawns and gardens, especially at the edges of woodlands and around old stone walls. Within the areas where ticks generally live, no naturally vegetated area can be considered completely free of infected ticks. The best precaution against RMSF is to avoid contact with soil, leaf litter and vegetation as much as possible in tick infested areas. For those who enjoy gardening or walking in their yards, clear brush and mow tall grass around houses and at the edges of gardens. This may help reduce the tick population in the immediate area. Applications of chemical insecticides by a licensed professional in the spring (late May) and Fall (September) will also control ticks, especially in heavily infested areas. Treatment will never get rid of all the ticks. Getting rid of small animal populations that host ticks will also decrease the tick population. When working in the garden,   pruning shrubs, or handling soil and vegetation, wear light-colored protective clothing and gloves. Spot-check often to prevent ticks from reaching the skin. Ticks cannot jump or fly. They will not drop from an above-ground perch onto a passing animal. Once a tick gains access to human skin it climbs upward  until it reaches a more protected area. For example, the back of the knee, groin, navel, armpit, ears or nape of the neck. It then begins the slow process of embedding itself in the skin. Campers, hikers, field workers, and others who spend time in wooded, brushy or tall grassy areas can avoid exposure to ticks by using the following precautions:  Wear light-colored clothing with a tight weave to spot ticks more easily and prevent contact with the skin.  Wear long pants tucked into socks, long-sleeved shirts tucked into pants and enclosed shoes or boots along with insect repellent.  Spray clothes with insect repellent containing either DEET or Permethrin. Only DEET can be used on exposed skin. Follow the manufacturer's directions carefully.  Wear a hat and keep long hair pulled back.  Stay on cleared, well-worn trails whenever possible.  Spot-check yourself and others often for the presence of ticks on clothes. If you find one, there are likely to be others. Check thoroughly.  Remove clothes after leaving tick-infested areas. If possible, wash them to eliminate any unseen ticks. Check yourself, your children and any pets from head to toe for the presence of ticks.  Shower and shampoo. You can greatly reduce your chances of contracting RMSF if you remove attached ticks as soon as possible. Regular checks of the body, including all body sites covered by hair (head, armpits, genitals), allow removal of the tick before rickettsial transmission. To remove an attached tick, use a forceps or tweezers to detach the intact tick without leaving mouth parts in the skin. The tick bite wound should be cleansed after tick removal. Remember the most common symptoms of RMSF are fever, muscle aches, headache and nausea or vomiting with a later onset of rash. If you get these symptoms after a tick bite and while living in an area where RMSF is found, RMSF should be suspected. If the disease is not treated, it can  cause death. See your caregiver immediately if you get these symptoms. Do this even if not aware of a tick bite. Document Released: 12/27/2000 Document Revised: 12/07/2011 Document Reviewed: 08/19/2009 [facility] Patient Information 2013 [place], [place]. Diet for Diarrhea, Adult Having frequent, runny stools (diarrhea) has many causes. Diarrhea may be caused or worsened by food or drink. Diarrhea may be relieved by changing your diet. IF YOU ARE NOT TOLERATING SOLID FOODS:  Drink enough water and fluids to keep your urine clear or pale yellow.  Avoid sugary drinks and sodas as well as milk-based beverages.  Avoid beverages containing caffeine and alcohol.  You may try rehydrating beverages. You can make your own by following this recipe:   tsp table salt.   tsp baking soda.   tsp salt substitute (potassium chloride).  1 tbs + 1 tsp sugar.  1 qt water. As your stools become more solid, you can start eating solid foods. Add foods one at a time. If a certain food causes your diarrhea to get worse, avoid that food and try other foods. A low fiber, low-fat, and lactose-free diet is recommended. Small, frequent meals may be better tolerated.  Starches  Allowed:  White, [place], and pita breads, plain rolls, buns, bagels. Plain muffins, matzo. Soda, saltine,  or graham crackers. Pretzels, melba toast, zwieback. Cooked cereals made with water: cornmeal, farina, cream cereals. Dry cereals: refined corn, wheat, rice. Potatoes prepared any way without skins, refined macaroni, spaghetti, noodles, refined rice.  Avoid:  Bread, rolls, or crackers made with whole wheat, multi-grains, rye, bran seeds, nuts, or coconut. Corn tortillas or taco shells. Cereals containing whole grains, multi-grains, bran, coconut, nuts, or raisins. Cooked or dry oatmeal. Coarse wheat cereals, granola. Cereals advertised as "high-fiber." Potato skins. Whole grain pasta, wild or brown rice. Popcorn. Sweet potatoes/yams. Sweet  rolls, doughnuts, waffles, pancakes, sweet breads. Vegetables  Allowed: Strained tomato and vegetable juices. Most well-cooked and canned vegetables without seeds. Fresh: Tender lettuce, cucumber without the skin, cabbage, spinach, bean sprouts.  Avoid: Fresh, cooked, or canned: Artichokes, baked beans, beet greens, broccoli, Brussels sprouts, corn, kale, legumes, peas, sweet potatoes. Cooked: Green or red cabbage, spinach. Avoid large servings of any vegetables, because vegetables shrink when cooked, and they contain more fiber per serving than fresh vegetables. Fruit  Allowed: All fruit juices except prune juice. Cooked or canned: Apricots, applesauce, cantaloupe, cherries, fruit cocktail, grapefruit, grapes, kiwi, mandarin oranges, peaches, pears, plums, watermelon. Fresh: Apples without skin, ripe banana, grapes, cantaloupe, cherries, grapefruit, peaches, oranges, plums. Keep servings limited to  cup or 1 piece.  Avoid: Fresh: Apple with skin, apricots, mango, pears, raspberries, strawberries. Prune juice, stewed or dried prunes. Dried fruits, raisins, dates. Large servings of all fresh fruits. Meat and Meat Substitutes  Allowed: Ground or well-cooked tender beef, ham, veal, [place], pork, or poultry. Eggs, plain cheese. Fish, oysters, shrimp, lobster, other seafoods. Liver, organ meats.  Avoid: Tough, fibrous meats with gristle. Peanut butter, smooth or chunky. Cheese, nuts, seeds, legumes, dried peas, beans, lentils. Milk  Allowed: Yogurt, lactose-free milk, kefir, drinkable yogurt, buttermilk, soy milk.  Avoid: Milk, chocolate milk, beverages made with milk, such as milk shakes. Soups  Allowed: Bouillon, broth, or soups made from allowed foods. Any strained soup.  Avoid: Soups made from vegetables that are not allowed, cream or milk-based soups. Desserts and Sweets  Allowed: Sugar-free gelatin, sugar-free frozen ice pops made without sugar alcohol.  Avoid: Plain cakes and cookies,  pie made with allowed fruit, pudding, custard, cream pie. Gelatin, fruit, ice, sherbet, frozen ice pops. Ice cream, ice milk without nuts. Plain hard candy, honey, jelly, molasses, syrup, sugar, chocolate syrup, gumdrops, marshmallows. Fats and Oils  Allowed: Avoid any fats and oils.  Avoid: Seeds, nuts, olives, avocados. Margarine, butter, cream, mayonnaise, salad oils, plain salad dressings made from allowed foods. Plain gravy, crisp bacon without rind. Beverages  Allowed: Water, decaffeinated teas, oral rehydration solutions, sugar-free beverages.  Avoid: Fruit juices, caffeinated beverages (coffee, tea, soda or pop), alcohol, sports drinks, or lemon-lime soda or pop. Condiments  Allowed: Ketchup, mustard, horseradish, vinegar, cream sauce, cheese sauce, cocoa powder. Spices in moderation: allspice, basil, bay leaves, celery powder or leaves, cinnamon, cumin powder, curry powder, ginger, mace, marjoram, onion or garlic powder, oregano, paprika, parsley flakes, ground pepper, rosemary, sage, savory, tarragon, thyme, turmeric.  Avoid: Coconut, honey. Weight Monitoring: Weigh yourself every day. You should weigh yourself in the morning after you urinate and before you eat breakfast. Wear the same amount of clothing when you weigh yourself. Record your weight daily. Bring your recorded weights to your clinic visits. Tell your caregiver right away if you have gained 3 lb/1.4 kg or more in 1 day, 5 lb/2.3 kg in a week, or whatever amount you were told to report. SEEK IMMEDIATE MEDICAL  CARE IF:   You are unable to keep fluids down.  You start to throw up (vomit) or diarrhea keeps coming back (persistent).  Abdominal pain develops, increases, or can be felt in one place (localizes).  You have an oral temperature above 102 F (38.9 C), not controlled by medicine.  Diarrhea contains blood or mucus.  You develop excessive weakness, dizziness, fainting, or extreme thirst. MAKE SURE YOU:    Understand these instructions.  Will watch your condition.  Will get help right away if you are not doing well or get worse. Document Released: 12/05/2003 Document Revised: 12/07/2011 Document Reviewed: 01/29/2012 [facility] Patient Information 2013 [place], [place].

## 2012-12-27 NOTE — Progress Notes (Signed)
  Subjective:    Patient ID: Leonard Holland, male    DOB: March 05, 1956, 57 y.o.   MRN: [id number]  HPI Has over 1 week of HA, fever, nausea, rare diarrhea, scrathy throat. No rash or ticks seen, but has 2 cats. No chest sxs and no abdominal pain, no urinary sxs. No exposure hx.   Review of Systems depression    Objective:   Physical Exam  Vitals reviewed. Constitutional: He is oriented to person, place, and time. He appears well-developed and well-nourished. He appears listless. No distress.  HENT:  Right Ear: External ear normal.  Left Ear: External ear normal.  Nose: Nose normal.  Mouth/Throat: Oropharynx is clear and moist.  Eyes: EOM are normal. Pupils are equal, round, and reactive to light. No scleral icterus.  Neck: Normal range of motion. Neck supple. No thyromegaly present.  Cardiovascular: Normal rate, regular rhythm and normal heart sounds.   Pulmonary/Chest: Effort normal and breath sounds normal.  Abdominal: Soft. Bowel sounds are normal. He exhibits no mass. There is no tenderness.  Musculoskeletal: Normal range of motion.  Lymphadenopathy:    He has no cervical adenopathy.  Neurological: He is oriented to person, place, and time. He appears listless. He exhibits normal muscle tone. Coordination normal.  Skin: No rash noted.  Psychiatric: He has a normal mood and affect.   Appears sick  Results for orders placed in visit on 12/27/12  POCT RAPID STREP A (OFFICE)      Result Value Range   Rapid Strep A Screen Negative  Negative  POCT CBC      Result Value Range   WBC 5.9  4.6 - 10.2 K/uL   Lymph, poc 1.3  0.6 - 3.4   POC LYMPH PERCENT 22.5  10 - 50 %L   MID (cbc) 0.5  0 - 0.9   POC MID % 8.4  0 - 12 %M   POC Granulocyte 4.1  2 - 6.9   Granulocyte percent 69.1  37 - 80 %G   RBC 6.13  4.69 - 6.13 M/uL   Hemoglobin 16.5  14.1 - 18.1 g/dL   HCT, POC 40.9  81.1 - 53.7 %   MCV 82.4  80 - 97 fL   MCH, POC 26.9 (*) 27 - 31.2 pg   MCHC 32.7  31.8 - 35.4 g/dL   RDW,  POC 91.4     Platelet Count, POC 323  142 - 424 K/uL   MPV 10.4  0 - 99.8 fL        Assessment & Plan:  Fatigue/Fever/HA Doxycycline 100mg  bid/Zofran 8mg

## 2012-12-28 LAB — COMPREHENSIVE METABOLIC PANEL
ALT: 25 U/L (ref 0–53)
Alkaline Phosphatase: 57 U/L (ref 39–117)
CO2: 25 mEq/L (ref 19–32)
Creat: 1.13 mg/dL (ref 0.50–1.35)
Sodium: 140 mEq/L (ref 135–145)
Total Bilirubin: 0.5 mg/dL (ref 0.3–1.2)
Total Protein: 6.5 g/dL (ref 6.0–8.3)

## 2012-12-30 LAB — ROCKY MTN SPOTTED FVR AB, IGM-BLOOD: ROCKY MTN SPOTTED FEVER, IGM: 0.68 IV

## 2013-01-18 ENCOUNTER — Emergency Department (HOSPITAL_COMMUNITY)
Admission: EM | Admit: 2013-01-18 | Discharge: 2013-01-19 | Disposition: A | Discharge status: Home / Self Care | Payer: BC Managed Care – PPO | Attending: Emergency Medicine | Admitting: Emergency Medicine

## 2013-01-18 ENCOUNTER — Encounter (HOSPITAL_COMMUNITY): Payer: Self-pay | Admitting: *Deleted

## 2013-01-18 DIAGNOSIS — Z8619 Personal history of other infectious and parasitic diseases: Secondary | ICD-10-CM | POA: Insufficient documentation

## 2013-01-18 DIAGNOSIS — Y929 Unspecified place or not applicable: Secondary | ICD-10-CM | POA: Insufficient documentation

## 2013-01-18 DIAGNOSIS — Z8679 Personal history of other diseases of the circulatory system: Secondary | ICD-10-CM | POA: Insufficient documentation

## 2013-01-18 DIAGNOSIS — R51 Headache: Secondary | ICD-10-CM

## 2013-01-18 DIAGNOSIS — Z8659 Personal history of other mental and behavioral disorders: Secondary | ICD-10-CM | POA: Insufficient documentation

## 2013-01-18 DIAGNOSIS — Y939 Activity, unspecified: Secondary | ICD-10-CM | POA: Insufficient documentation

## 2013-01-18 DIAGNOSIS — H53149 Visual discomfort, unspecified: Secondary | ICD-10-CM

## 2013-01-18 DIAGNOSIS — R110 Nausea: Secondary | ICD-10-CM

## 2013-01-18 DIAGNOSIS — G43109 Migraine with aura, not intractable, without status migrainosus: Secondary | ICD-10-CM | POA: Insufficient documentation

## 2013-01-18 DIAGNOSIS — S0990XA Unspecified injury of head, initial encounter: Secondary | ICD-10-CM | POA: Insufficient documentation

## 2013-01-18 DIAGNOSIS — W19XXXA Unspecified fall, initial encounter: Secondary | ICD-10-CM

## 2013-01-18 DIAGNOSIS — R296 Repeated falls: Secondary | ICD-10-CM | POA: Insufficient documentation

## 2013-01-18 DIAGNOSIS — M542 Cervicalgia: Secondary | ICD-10-CM

## 2013-01-18 DIAGNOSIS — R42 Dizziness and giddiness: Secondary | ICD-10-CM | POA: Insufficient documentation

## 2013-01-18 DIAGNOSIS — Z79899 Other long term (current) drug therapy: Secondary | ICD-10-CM | POA: Insufficient documentation

## 2013-01-18 DIAGNOSIS — R209 Unspecified disturbances of skin sensation: Secondary | ICD-10-CM | POA: Insufficient documentation

## 2013-01-18 DIAGNOSIS — Z880 Allergy status to penicillin: Secondary | ICD-10-CM | POA: Insufficient documentation

## 2013-01-18 DIAGNOSIS — R011 Cardiac murmur, unspecified: Secondary | ICD-10-CM | POA: Insufficient documentation

## 2013-01-18 DIAGNOSIS — E878 Other disorders of electrolyte and fluid balance, not elsewhere classified: Secondary | ICD-10-CM | POA: Insufficient documentation

## 2013-01-18 HISTORY — DX: Spotted fever due to Rickettsia rickettsii: A77.0

## 2013-01-18 HISTORY — DX: Migraine, unspecified, not intractable, without status migrainosus: G43.909

## 2013-01-18 HISTORY — DX: Nonrheumatic mitral (valve) prolapse: I34.1

## 2013-01-18 LAB — GLUCOSE, CAPILLARY

## 2013-01-18 NOTE — ED Notes (Signed)
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 LOC

## 2013-01-19 ENCOUNTER — Emergency Department (HOSPITAL_COMMUNITY): Payer: BC Managed Care – PPO

## 2013-01-19 ENCOUNTER — Encounter (HOSPITAL_COMMUNITY): Payer: Self-pay

## 2013-01-19 LAB — BASIC METABOLIC PANEL
BUN: 15 mg/dL (ref 6–23)
CO2: 23 mEq/L (ref 19–32)
Calcium: 9.3 mg/dL (ref 8.4–10.5)
Creatinine, Ser: 1.17 mg/dL (ref 0.50–1.35)
GFR calc non Af Amer: 68 mL/min — ABNORMAL LOW (ref >90)
Glucose, Bld: 92 mg/dL (ref 70–99)

## 2013-01-19 LAB — DIFFERENTIAL
Basophils Absolute: 0.1 10*3/uL (ref 0.0–0.1)
Lymphocytes Relative: 27 % (ref 12–46)
Lymphs Abs: 1.9 10*3/uL (ref 0.7–4.0)
Neutro Abs: 4.1 10*3/uL (ref 1.7–7.7)

## 2013-01-19 LAB — CBC
Hemoglobin: 16.4 g/dL (ref 13.0–17.0)
MCH: 27.4 pg (ref 26.0–34.0)
MCHC: 35 g/dL (ref 30.0–36.0)
MCV: 78.3 fL (ref 78.0–100.0)
RBC: 5.99 MIL/uL — ABNORMAL HIGH (ref 4.22–5.81)

## 2013-01-19 MED ORDER — PROCHLORPERAZINE EDISYLATE 5 MG/ML IJ SOLN
10.0000 mg | Freq: Four times a day (QID) | INTRAMUSCULAR | Status: DC | PRN
  Administered 2013-01-19: 10 mg via INTRAVENOUS
  Filled 2013-01-19: qty 2

## 2013-01-19 MED ORDER — IOHEXOL 350 MG/ML SOLN
100.0000 mL | Freq: Once | INTRAVENOUS | Status: AC | PRN
  Administered 2013-01-19: 100 mL via INTRAVENOUS

## 2013-01-19 MED ORDER — SODIUM CHLORIDE 0.9 % IV BOLUS (SEPSIS)
500.0000 mL | Freq: Once | INTRAVENOUS | Status: AC
  Administered 2013-01-19: 500 mL via INTRAVENOUS

## 2013-01-19 MED ORDER — DIPHENHYDRAMINE HCL 50 MG/ML IJ SOLN
12.5000 mg | Freq: Once | INTRAMUSCULAR | Status: AC
  Administered 2013-01-19: 12.5 mg via INTRAVENOUS
  Filled 2013-01-19: qty 1

## 2013-01-19 MED ORDER — PROCHLORPERAZINE MALEATE 10 MG PO TABS: 10.0000 mg | ORAL_TABLET | Freq: Two times a day (BID) | ORAL | Status: DC | PRN

## 2013-01-19 MED ORDER — DEXAMETHASONE SODIUM PHOSPHATE 10 MG/ML IJ SOLN
10.0000 mg | Freq: Once | INTRAMUSCULAR | Status: AC
  Administered 2013-01-19: 10 mg via INTRAVENOUS
  Filled 2013-01-19: qty 1

## 2013-01-19 MED ORDER — DIPHENHYDRAMINE HCL 25 MG PO CAPS: 25.0000 mg | ORAL_CAPSULE | Freq: Four times a day (QID) | ORAL | Status: DC | PRN

## 2013-01-19 MED ORDER — IBUPROFEN 800 MG PO TABS: 800.0000 mg | ORAL_TABLET | Freq: Three times a day (TID) | ORAL | Status: DC

## 2013-01-19 NOTE — ED Provider Notes (Signed)
History     CSN: [id number]  Arrival date & time 01/18/13  2303   First MD Initiated Contact with Patient 01/19/13 0029      Chief Complaint  Patient presents with  . Headache  . Dizziness   HPI Leonard Holland is a 57 y.o. male has a h/o migraine headaches and also a recent Dx of RMSF who completed treatment with doxycycline arrives with headache, neck pain and 2 falls this evening.  Pt has typical migraine headache pain today however new severe neck pain that is not palpable, is somewhat worse based on neck position, bilateral finger tingling, and dysequilibrium which he describes as "off balance" like "when you're in a car."   Pt fell twice tonight.  He's had associated nausea, no vomiting, no chest pain, shortness of breath, fever, chills, abdominal pain, weakness, dysarthria or dysphagia. No cough, dysuria, or frequency.   Pt says his migraine feels similar to typical migraines, but other symptoms are completely new.  No family history of cerebral aneurysm, CVA, hemorrhagic stroke, myocardial infarction.  Past Medical History  Diagnosis Date  . Heart murmur   . Migraines   . Depression   . [facility] spotted fever   . Migraine   . Mitral valve prolapse   . Depression     Past Surgical History  Procedure Laterality Date  . Hernia repair    . Vasectomy    . Wrist surgery Right     tendon chief release    History reviewed. No pertinent family history.  History  Substance Use Topics  . Smoking status: Never Smoker   . Smokeless tobacco: Not on file  . Alcohol Use: 0.6 oz/week    1 Cans of beer per week      Review of Systems At least 10pt or greater review of systems completed and are negative except where specified in the HPI.  Allergies  Penicillins  Home Medications   Current Outpatient Rx  Name  Route  Sig  Dispense  Refill  . Naproxen Sodium (ALEVE) 220 MG CAPS   Oral   Take 440 mg by mouth 2 (two) times daily as needed. For pain         .  diphenhydrAMINE (BENADRYL) 25 mg capsule   Oral   Take 1 capsule (25 mg total) by mouth every 6 (six) hours as needed for itching (Take with Compazine for migraine headache).   10 capsule   0   . ibuprofen (ADVIL,MOTRIN) 800 MG tablet   Oral   Take 1 tablet (800 mg total) by mouth 3 (three) times daily.   30 tablet   0   . prochlorperazine (COMPAZINE) 10 MG tablet   Oral   Take 1 tablet (10 mg total) by mouth 2 (two) times daily as needed (Nausea vomiting or migraine headache).   10 tablet   0     BP 119/86  Pulse 102  Temp(Src) 98.4 F (36.9 C) (Oral)  Resp 18  Wt 188 lb (85.276 kg)  BMI 28.59 kg/m2  SpO2 96%  Physical Exam  PHYSICAL EXAM: VITAL SIGNS:  . Filed Vitals:   01/19/13 0000 01/19/13 0015 01/19/13 0030 01/19/13 0259  BP:  148/92 142/94 119/86  Pulse: 74 74 74 102  Temp:    98.4 F (36.9 C)  TempSrc:    Oral  Resp: 13 16 16 18   Weight:      SpO2: 96% 94% 94% 96%   CONSTITUTIONAL: Awake, oriented, appears non-toxic  HENT: Atraumatic, normocephalic, oral mucosa pink and moist, airway patent. Nares patent without drainage. External ears normal. EYES: Conjunctiva clear, EOMI, PERRLA NECK: Trachea midline, non-tender, supple CARDIOVASCULAR: Normal heart rate, Normal rhythm, No murmurs, rubs, gallops PULMONARY/CHEST: Clear to auscultation, no rhonchi, wheezes, or rales. Symmetrical breath sounds. CHEST WALL: No lesions. Non-tender. ABDOMINAL: Non-distended, soft, non-tender - no rebound or guarding.  BS normal. NEUROLOGIC: VH:QIONGE fields intact. PERRLA, EOMI.  Facial sensation equal to light touch bilaterally.  Good muscle bulk in the masseter muscle and good lateral movement of the jaw.  Facial expressions equal and good strength with smile/frown and puffed cheeks.  Hearing grossly intact to finger rub test.  Uvula, tongue are midline with no deviation. Symmetrical palate elevation.  Trapezius and SCM muscles are 5/5 strength bilaterally.   DTR:  Brachioradialis, biceps, patellar, Achilles tendon reflexes 2+ bilaterally.  No clonus. Strength: 5/5 strength flexors and extensors in the upper and lower extremities.  Grip strength, finger adduction/abduction 5/5. Sensation: Sensation intact distally to light touch, proprioception using position testing of 2nd digit and great toe Cerebellar: No ataxia with walking or dysmetria with finger to nose, rapid alternating hand movements and heels to shin testing. Gait and Station: Normal heel/toe, and tandem gait.  Negative Romberg, no pronator drift EXTREMITIES: No clubbing, cyanosis, or edema SKIN: Warm, Dry, No erythema, No rash   ED Course  Procedures (including critical care time)  Date: 01/19/2013  Rate: 72  Rhythm: normal sinus rhythm  QRS Axis: normal  Intervals: normal  ST/T Wave abnormalities: normal  Conduction Disutrbances: RSR prime in V1 V2  Narrative Interpretation: unremarkable - nonischemic EKG  Labs Reviewed  CBC - Abnormal; Notable for the following:    RBC 5.99 (*)    All other components within normal limits  BASIC METABOLIC PANEL - Abnormal; Notable for the following:    GFR calc non Af Amer 68 (*)    GFR calc Af Amer 78 (*)    All other components within normal limits  TROPONIN I  GLUCOSE, CAPILLARY  DIFFERENTIAL  CBC WITH DIFFERENTIAL   Ct Angio Head W/cm &/or Wo Cm  01/19/2013  *RADIOLOGY REPORT*  Clinical Data:  57 year old male with headache, nausea, dizziness, neck pain, disequilibrium.  Recently diagnosed with rocking spotted fever.  CT ANGIOGRAPHY HEAD AND NECK  Technique:  Multidetector CT imaging of the head and neck was performed using the standard protocol during bolus administration of intravenous contrast.  Multiplanar CT image reconstructions including MIPs were obtained to evaluate the vascular anatomy. Carotid stenosis measurements (when applicable) are obtained utilizing NASCET criteria, using the distal internal carotid diameter as the  denominator.  Contrast: OMNIPAQUE IOHEXOL 350 MG/ML SOLN  Comparison:   None.  CTA NECK  Findings:  Negative lung apices.  No superior mediastinal lymphadenopathy.  Thyroid, pharynx, parapharyngeal spaces, retropharyngeal space, sublingual space, submandibular glands and parotid glands are within normal limits.  There is mild asymmetry of the larynx, such that left vocal cord paralysis or dysfunction could not be excluded.  No laryngeal mass.  The AP window is not included.  Maximal left level IA lymph node measuring 10 mm in short axis. Surrounding smaller level I nodes are within normal limits.  Other cervical nodal stations are within normal limits.  Right maxillary sinus small mucous retention cyst.  Other Visualized paranasal sinuses and mastoids are clear.  Visualized orbit soft tissues are within normal limits.  No acute osseous abnormality identified.  Degenerative changes in the cervical spine. No  acute osseous abnormality identified.  Vascular Findings: Three-vessel arch configuration.  No arch atherosclerosis.  Normal great vessel origins.  Streak artifact partially obscures the brachial cephalic artery. Right CCA origin appears normal.  Normal right CCA and carotid bifurcation.  Normal cervical right ICA aside from mild tortuosity.  No proximal right subclavian artery stenosis.  Normal right vertebral artery origin.  Normal cervical right vertebral artery.  Normal left CCA origin.  Normal left CCA.  Normal left carotid bifurcation.  Moderate to severely tortuous mid cervical left ICA otherwise is within normal limits.  No proximal left subclavian artery stenosis.  Normal left vertebral artery origin.  Mildly tortuous proximal left vertebral artery. Otherwise normal cervical left vertebral artery.   Review of the MIP images confirms the above findings.  IMPRESSION: 1.  Negative neck CTA aside from occasional arterial tortuosity. Intracranial findings below. 2.  Mild asymmetry of the larynx.  In the  appropriate clinical setting a left vocal cord paralysis could not be excluded. 3.  Nonspecific maximal left level IA lymph node.  CTA HEAD  Findings:  Calvarium intact. Visualized scalp soft tissues are within normal limits.  Incidental choroid plexus cysts. Cerebral volume is within normal limits for age.  No midline shift, ventriculomegaly, mass effect, evidence of mass lesion, intracranial hemorrhage or evidence of cortically based acute infarction.  Gray-white matter differentiation is within normal limits throughout the brain.  No abnormal enhancement identified.  Vascular Findings: Major intracranial venous structures are enhancing.  Negative distal vertebral arteries.  Both PICA vessels are patent. Patent vertebrobasilar junction.  Mild basilar tortuosity.  AICA origins are patent.  No basilar stenosis.  SCA and PCA origins are within normal limits.  Right posterior communicating artery is present.  The left is more diminutive or absent.  Bilateral PCA branches are within normal limits.  ICA siphons are patent without atherosclerosis or stenosis. Ophthalmic and right posterior communicating artery origins are within normal limits.  Carotid termini are normal.  MCA and ACA origins are normal.  Anterior communicating artery is within normal limits.  Tortuous proximal ACA A2 segment.  Otherwise the ACA branches are normal. Bilateral MCA M1 segments are within normal limits.  Bilateral MCA branches are within normal limits.   Review of the MIP images confirms the above findings.  IMPRESSION: 1.  Negative intracranial CTA. 2. Normal CT appearance of the brain.  A preliminary report without significant discrepancy to the above was issued by Dr. Leonie Douglas zone at 0213 hours on 01/19/2013.   Original Report Authenticated By: Erskine Speed, M.D.    Ct Angio Neck W/cm &/or Wo/cm  01/19/2013  *RADIOLOGY REPORT*  Clinical Data:  57 year old male with headache, nausea, dizziness, neck pain, disequilibrium.  Recently  diagnosed with rocking spotted fever.  CT ANGIOGRAPHY HEAD AND NECK  Technique:  Multidetector CT imaging of the head and neck was performed using the standard protocol during bolus administration of intravenous contrast.  Multiplanar CT image reconstructions including MIPs were obtained to evaluate the vascular anatomy. Carotid stenosis measurements (when applicable) are obtained utilizing NASCET criteria, using the distal internal carotid diameter as the denominator.  Contrast: OMNIPAQUE IOHEXOL 350 MG/ML SOLN  Comparison:   None.  CTA NECK  Findings:  Negative lung apices.  No superior mediastinal lymphadenopathy.  Thyroid, pharynx, parapharyngeal spaces, retropharyngeal space, sublingual space, submandibular glands and parotid glands are within normal limits.  There is mild asymmetry of the larynx, such that left vocal cord paralysis or dysfunction could not be excluded.  No laryngeal mass.  The AP window is not included.  Maximal left level IA lymph node measuring 10 mm in short axis. Surrounding smaller level I nodes are within normal limits.  Other cervical nodal stations are within normal limits.  Right maxillary sinus small mucous retention cyst.  Other Visualized paranasal sinuses and mastoids are clear.  Visualized orbit soft tissues are within normal limits.  No acute osseous abnormality identified.  Degenerative changes in the cervical spine. No acute osseous abnormality identified.  Vascular Findings: Three-vessel arch configuration.  No arch atherosclerosis.  Normal great vessel origins.  Streak artifact partially obscures the brachial cephalic artery. Right CCA origin appears normal.  Normal right CCA and carotid bifurcation.  Normal cervical right ICA aside from mild tortuosity.  No proximal right subclavian artery stenosis.  Normal right vertebral artery origin.  Normal cervical right vertebral artery.  Normal left CCA origin.  Normal left CCA.  Normal left carotid bifurcation.  Moderate to  severely tortuous mid cervical left ICA otherwise is within normal limits.  No proximal left subclavian artery stenosis.  Normal left vertebral artery origin.  Mildly tortuous proximal left vertebral artery. Otherwise normal cervical left vertebral artery.   Review of the MIP images confirms the above findings.  IMPRESSION: 1.  Negative neck CTA aside from occasional arterial tortuosity. Intracranial findings below. 2.  Mild asymmetry of the larynx.  In the appropriate clinical setting a left vocal cord paralysis could not be excluded. 3.  Nonspecific maximal left level IA lymph node.  CTA HEAD  Findings:  Calvarium intact. Visualized scalp soft tissues are within normal limits.  Incidental choroid plexus cysts. Cerebral volume is within normal limits for age.  No midline shift, ventriculomegaly, mass effect, evidence of mass lesion, intracranial hemorrhage or evidence of cortically based acute infarction.  Gray-white matter differentiation is within normal limits throughout the brain.  No abnormal enhancement identified.  Vascular Findings: Major intracranial venous structures are enhancing.  Negative distal vertebral arteries.  Both PICA vessels are patent. Patent vertebrobasilar junction.  Mild basilar tortuosity.  AICA origins are patent.  No basilar stenosis.  SCA and PCA origins are within normal limits.  Right posterior communicating artery is present.  The left is more diminutive or absent.  Bilateral PCA branches are within normal limits.  ICA siphons are patent without atherosclerosis or stenosis. Ophthalmic and right posterior communicating artery origins are within normal limits.  Carotid termini are normal.  MCA and ACA origins are normal.  Anterior communicating artery is within normal limits.  Tortuous proximal ACA A2 segment.  Otherwise the ACA branches are normal. Bilateral MCA M1 segments are within normal limits.  Bilateral MCA branches are within normal limits.   Review of the MIP images confirms  the above findings.  IMPRESSION: 1.  Negative intracranial CTA. 2. Normal CT appearance of the brain.  A preliminary report without significant discrepancy to the above was issued by Dr. Leonie Douglas zone at 0213 hours on 01/19/2013.   Original Report Authenticated By: Erskine Speed, M.D.      1. Basilar migraine   2. Headache   3. Disequilibrium   4. Fall, initial encounter   5. Nausea   6. Photophobia   7. Acute neck pain       MDM  Leonard Holland is a 57 y.o. male presenting with components of typical migraine headache while the patient does have new neck pain it is not palpable, this is also associated with disequilibrium and nausea, and  is obvious concern for carotid dissection, a posterior circulation ischemia, possibly basilar migraine. Patient is fallen twice tonight, patient previously has not had any balance issues.  I do not suspect any central nervous system infection, do not suspect cardiac cause. Patient's EKG is unremarkable, basic labs are within normal limits.  Patient treated with migraine cocktail and obtain CTA of head and neck.  Preliminary CTA results called by radiologist - no acute intracranial abnormality found, vasculature of the neck appears normal. No posterior circulation abnormalities.  Patient feeling much better after migraine cocktail, suggesting basilar migraine, new onset. Patient ambulated, patient ambulates unassisted, normally.  Pt to follow up with neurologist.    I explained the diagnosis and have given explicit precautions to return to the ER including recurrent symptoms, fevers, chills, confusion or any other new or worsening symptoms. The patient understands and accepts the medical plan as it's been dictated and I have answered their questions. Discharge instructions concerning home care and prescriptions have been given.  The patient is STABLE and is discharged to home in good condition.              Jones Skene, MD 01/19/13 2359

## 2013-02-04 ENCOUNTER — Ambulatory Visit (INDEPENDENT_AMBULATORY_CARE_PROVIDER_SITE_OTHER): Payer: BC Managed Care – PPO | Admitting: Internal Medicine

## 2013-02-04 VITALS — BP 121/84 | HR 77 | Temp 97.6°F | Resp 18 | Wt 188.0 lb

## 2013-02-04 DIAGNOSIS — R3915 Urgency of urination: Secondary | ICD-10-CM

## 2013-02-04 DIAGNOSIS — G4700 Insomnia, unspecified: Secondary | ICD-10-CM

## 2013-02-04 DIAGNOSIS — Z1211 Encounter for screening for malignant neoplasm of colon: Secondary | ICD-10-CM

## 2013-02-04 DIAGNOSIS — R5381 Other malaise: Secondary | ICD-10-CM

## 2013-02-04 DIAGNOSIS — R51 Headache: Secondary | ICD-10-CM

## 2013-02-04 DIAGNOSIS — R5383 Other fatigue: Secondary | ICD-10-CM

## 2013-02-04 DIAGNOSIS — N419 Inflammatory disease of prostate, unspecified: Secondary | ICD-10-CM

## 2013-02-04 DIAGNOSIS — R030 Elevated blood-pressure reading, without diagnosis of hypertension: Secondary | ICD-10-CM

## 2013-02-04 LAB — POCT CBC
Lymph, poc: 1.4 (ref 0.6–3.4)
MCH, POC: 26.9 pg — AB (ref 27–31.2)
MCHC: 31.6 g/dL — AB (ref 31.8–35.4)
MCV: 85.3 fL (ref 80–97)
MID (cbc): 0.4 (ref 0–0.9)
POC LYMPH PERCENT: 24.1 %L (ref 10–50)
Platelet Count, POC: 298 10*3/uL (ref 142–424)
RDW, POC: 14.5 %
WBC: 5.8 10*3/uL (ref 4.6–10.2)

## 2013-02-04 LAB — COMPREHENSIVE METABOLIC PANEL
BUN: 18 mg/dL (ref 6–23)
CO2: 26 mEq/L (ref 19–32)
Creat: 1.18 mg/dL (ref 0.50–1.35)
Glucose, Bld: 97 mg/dL (ref 70–99)
Sodium: 141 mEq/L (ref 135–145)
Total Bilirubin: 0.6 mg/dL (ref 0.3–1.2)
Total Protein: 6.9 g/dL (ref 6.0–8.3)

## 2013-02-04 LAB — POCT URINALYSIS DIPSTICK
Bilirubin, UA: NEGATIVE
Ketones, UA: NEGATIVE
Leukocytes, UA: NEGATIVE
pH, UA: 5

## 2013-02-04 LAB — LIPID PANEL: HDL: 48 mg/dL (ref >39)

## 2013-02-04 LAB — POCT UA - MICROSCOPIC ONLY
Casts, Ur, LPF, POC: NEGATIVE
Mucus, UA: POSITIVE

## 2013-02-04 LAB — PSA: PSA: 2.38 ng/mL (ref <=4.00)

## 2013-02-04 LAB — TSH: TSH: 1.448 u[IU]/mL (ref 0.350–4.500)

## 2013-02-04 MED ORDER — CIPROFLOXACIN HCL 500 MG PO TABS: 500.0000 mg | ORAL_TABLET | Freq: Two times a day (BID) | ORAL | Status: DC

## 2013-02-04 MED ORDER — ALPRAZOLAM 0.5 MG PO TABS: 0.5000 mg | ORAL_TABLET | Freq: Every evening | ORAL | Status: DC | PRN

## 2013-02-04 NOTE — Patient Instructions (Addendum)
Insomnia Insomnia is frequent trouble falling and/or staying asleep. Insomnia can be a long term problem or a short term problem. Both are common. Insomnia can be a short term problem when the wakefulness is related to a certain stress or worry. Long term insomnia is often related to ongoing stress during waking hours and/or poor sleeping habits. Overtime, sleep deprivation itself can make the problem worse. Every little thing feels more severe because you are overtired and your ability to cope is decreased. CAUSES   Stress, anxiety, and depression.  Poor sleeping habits.  Distractions such as TV in the bedroom.  Naps close to bedtime.  Engaging in emotionally charged conversations before bed.  Technical reading before sleep.  Alcohol and other sedatives. They may make the problem worse. They can hurt normal sleep patterns and normal dream activity.  Stimulants such as caffeine for several hours prior to bedtime.  Pain syndromes and shortness of breath can cause insomnia.  Exercise late at night.  Changing time zones may cause sleeping problems (jet lag). It is sometimes helpful to have someone observe your sleeping patterns. They should look for periods of not breathing during the night (sleep apnea). They should also look to see how long those periods last. If you live alone or observers are uncertain, you can also be observed at a sleep clinic where your sleep patterns will be professionally monitored. Sleep apnea requires a checkup and treatment. Give your caregivers your medical history. Give your caregivers observations your family has made about your sleep.  SYMPTOMS   Not feeling rested in the morning.  Anxiety and restlessness at bedtime.  Difficulty falling and staying asleep. TREATMENT   Your caregiver may prescribe treatment for an underlying medical disorders. Your caregiver can give advice or help if you are using alcohol or other drugs for self-medication. Treatment  of underlying problems will usually eliminate insomnia problems.  Medications can be prescribed for short time use. They are generally not recommended for lengthy use.  Over-the-counter sleep medicines are not recommended for lengthy use. They can be habit forming.  You can promote easier sleeping by making lifestyle changes such as:  Using relaxation techniques that help with breathing and reduce muscle tension.  Exercising earlier in the day.  Changing your diet and the time of your last meal. No night time snacks.  Establish a regular time to go to bed.  Counseling can help with stressful problems and worry.  Soothing music and white noise may be helpful if there are background noises you cannot remove.  Stop tedious detailed work at least one hour before bedtime. HOME CARE INSTRUCTIONS   Keep a diary. Inform your caregiver about your progress. This includes any medication side effects. See your caregiver regularly. Take note of:  Times when you are asleep.  Times when you are awake during the night.  The quality of your sleep.  How you feel the next day. This information will help your caregiver care for you.  Get out of bed if you are still awake after 15 minutes. Read or do some quiet activity. Keep the lights down. Wait until you feel sleepy and go back to bed.  Keep regular sleeping and waking hours. Avoid naps.  Exercise regularly.  Avoid distractions at bedtime. Distractions include watching television or engaging in any intense or detailed activity like attempting to balance the household checkbook.  Develop a bedtime ritual. Keep a familiar routine of bathing, brushing your teeth, climbing into bed at the same   time each night, listening to soothing music. Routines increase the success of falling to sleep faster.  Use relaxation techniques. This can be using breathing and muscle tension release routines. It can also include visualizing peaceful scenes. You can  also help control troubling or intruding thoughts by keeping your mind occupied with boring or repetitive thoughts like the old concept of counting sheep. You can make it more creative like imagining planting one beautiful flower after another in your backyard garden.  During your day, work to eliminate stress. When this is not possible use some of the previous suggestions to help reduce the anxiety that accompanies stressful situations. MAKE SURE YOU:   Understand these instructions.  Will watch your condition.  Will get help right away if you are not doing well or get worse. Document Released: 09/11/2000 Document Revised: 12/07/2011 Document Reviewed: 10/12/2007 [facility] Patient Information 2013 [place], [place]. Prostatitis The prostate gland is about the size and shape of a walnut. It is located just below your bladder. It produces one of the components of semen, which is made up of sperm and the fluids that help nourish and transport it out from the testicles. Prostatitis is redness, soreness, and swelling (inflammation) of the prostate gland.  There are 3 types of prostatitis:  Acute bacterial prostatitis This is the least common type of prostatitis. It starts quickly and usually leads to a bladder infection. It can occur at any age.  Chronic bacterial prostatitis This is a persistent bacterial infection in the prostate.It usually develops from repeated acute bacterial prostatitis or acute bacterial prostatitis that was not properly treated. It can occur in men of any age but is most common in middle-aged men whose prostate has begun to enlarge.  Chronic prostatitis chronic pelvic pain syndrome This is the most common type of prostatitis. It is inflammation of the prostate gland that is not caused by a bacterial infection. The cause is unknown. CAUSES The cause of acute and chronic bacterial prostatitis is a bacterial infection. The exact cause of chronic prostatitis and chronic pelvic  pain syndrome and asymptomatic inflammatory prostatitis is unknown.  SYMPTOMS  Symptoms can vary depending upon the type of prostatitis that exists. There can also be overlap in symptoms. Possible symptoms for each type of prostatitis are listed below. Acute bacterial prostatitis  Painful urination.  Fever or chills.  Muscle or joint pains.  Low back pain.  Low abdominal pain.  Inability to empty bladder completely.  Sudden urge to urinate.  Frequent urination.  Difficulty starting urine stream.  Weak urine stream.  Discharge from the urethra.  Dribbling after urination.  Rectal pain.  Pain in the testicles, penis, or tip of the penis.  Pain in the space between the anus and scrotum (perineum).  Problems with sexual function.  Painful ejaculation.  Bloody semen. Chronic bacterial prostatitis  The symptoms are similar to those of acute bacterial prostatitis, but they usually are much less severe. Fever, chills, and muscle and joint pain are not associated with chronic bacterial prostatitis. Chronic prostatitis chronic pelvic pain syndrome  Symptoms typically include a dull ache in the scrotum and the perineum. DIAGNOSIS  In order to diagnose prostatitis, your caregiver will ask about your symptoms. If acute or chronic bacterial prostatitis is suspected, a urine sample will be taken and tested (urinalysis). This is to see if there is bacteria in your urine. If the urinalysis result is negative for bacteria, your caregiver may use a finger to feel your prostate (digital rectal exam).  This exam helps your caregiver determine if your prostate is swollen and tender. TREATMENT  Treatment for prostatitis depends on the cause. If a bacterial infection is the cause, it can be treated with antibiotic medicine. In cases of chronic bacterial prostatitis, the use of antibiotics for up to 1 month may be necessary. Your caregiver may instruct you to take sitz baths to help relieve  pain. A sitz bath is a bath of hot water in which your hips and buttocks are under water. HOME CARE INSTRUCTIONS   Take all medicines as directed by your caregiver.  Take sitz baths as directed by your caregiver. SEEK MEDICAL CARE IF:   Your symptoms get worse, not better.  You have a fever. SEEK IMMEDIATE MEDICAL CARE IF:   You have chills.  You feel nauseous or vomit.  You feel lightheaded or faint.  You are unable to urinate.  You have blood or blood clots in your urine. Document Released: 09/11/2000 Document Revised: 12/07/2011 Document Reviewed: 08/17/2011 [facility] Patient Information 2013 [place], [place].

## 2013-02-04 NOTE — Progress Notes (Signed)
Subjective:    Patient ID: Leonard Holland, male    DOB: 07/03/1956, 57 y.o.   MRN: [id number]  HPI Has fatigue, ha, insomnia, feels bad. No anorexia, weight loss, night sweats,syncope. Had er w/up of chest pain and ha all neg including scans. Never had a colonocopy and needs. No bleeding seen.   Review of Systems  Constitutional: Positive for fatigue.  HENT: Negative.   Eyes: Negative.   Respiratory: Negative.   Cardiovascular: Positive for chest pain.  Gastrointestinal: Negative.   Endocrine: Negative.   Allergic/Immunologic: Negative.   Neurological: Negative.   Hematological: Negative.   Psychiatric/Behavioral: Positive for sleep disturbance and agitation.       Objective:   Physical Exam  Vitals reviewed. Constitutional: He is oriented to person, place, and time. He appears well-developed and well-nourished. No distress.  HENT:  Right Ear: External ear normal.  Left Ear: External ear normal.  Nose: Nose normal.  Mouth/Throat: Oropharynx is clear and moist.  Eyes: Conjunctivae and EOM are normal. Pupils are equal, round, and reactive to light.  Neck: Normal range of motion. Neck supple. No tracheal deviation present. No thyromegaly present.  Cardiovascular: Normal rate, regular rhythm and normal heart sounds.   No murmur heard. Pulmonary/Chest: Effort normal and breath sounds normal.  Abdominal: Soft. Bowel sounds are normal. He exhibits no mass. There is no tenderness. There is no rebound.  Genitourinary: Rectum normal, prostate normal and penis normal.  Musculoskeletal: Normal range of motion. He exhibits no edema and no tenderness.  Neurological: He is alert and oriented to person, place, and time. He has normal reflexes. No cranial nerve deficit. He exhibits normal muscle tone. Coordination normal.  Skin: Skin is warm. No rash noted.  Psychiatric: He has a normal mood and affect. His behavior is normal. Judgment and thought content normal.   Results for orders  placed in visit on 02/04/13  POCT CBC      Result Value Range   WBC 5.8  4.6 - 10.2 K/uL   Lymph, poc 1.4  0.6 - 3.4   POC LYMPH PERCENT 24.1  10 - 50 %L   MID (cbc) 0.4  0 - 0.9   POC MID % 7.5  0 - 12 %M   POC Granulocyte 4.0  2 - 6.9   Granulocyte percent 68.4  37 - 80 %G   RBC 5.90  4.69 - 6.13 M/uL   Hemoglobin 15.9  14.1 - 18.1 g/dL   HCT, POC 14.7  82.9 - 53.7 %   MCV 85.3  80 - 97 fL   MCH, POC 26.9 (*) 27 - 31.2 pg   MCHC 31.6 (*) 31.8 - 35.4 g/dL   RDW, POC 56.2     Platelet Count, POC 298  142 - 424 K/uL   MPV 11.4  0 - 99.8 fL  GLUCOSE, POCT (MANUAL RESULT ENTRY)      Result Value Range   POC Glucose 100 (*) 70 - 99 mg/dl  POCT URINALYSIS DIPSTICK      Result Value Range   Color, UA yellow     Clarity, UA clear     Glucose, UA neg     Bilirubin, UA neg     Ketones, UA neg     Spec Grav, UA 1.025     Blood, UA trace     pH, UA 5.0     Protein, UA trace     Urobilinogen, UA 0.2     Nitrite, UA neg  Leukocytes, UA Negative    POCT UA - MICROSCOPIC ONLY      Result Value Range   WBC, Ur, HPF, POC 4-5     RBC, urine, microscopic 4-5     Bacteria, U Microscopic 1+     Mucus, UA pos     Epithelial cells, urine per micros 0-2     Crystals, Ur, HPF, POC neg     Casts, Ur, LPF, POC neg     Yeast, UA neg            Assessment & Plan:  Fatigue/Insomnia UTI/Prostatits Schedule colonoscopy

## 2013-02-06 ENCOUNTER — Encounter: Payer: Self-pay | Admitting: Family Medicine

## 2013-02-18 IMAGING — CT CT ANGIO CHEST
1 of 2 series · 19 of 32 positions shown · IV contrast ([ID] [id number])
Comparison: None.

CLINICAL DATA: Left chest pain, shortness of breath, diaphoresis.
White cell count 8.1.

CT ANGIOGRAPHY CHEST
TECHNIQUE: Multidetector CT imaging of the chest using the
standard protocol during bolus administration of intravenous
contrast. Multiplanar reconstructed images including MIPs were
obtained and reviewed to evaluate the vascular anatomy.
Contrast: 100mL OMNIPAQUE IOHEXOL 350 MG/ML SOLN

[Series 6: thins for pacs · axial · 0.58mm/px · z∈[+156,+346]mm · 19 of 212 slices shown]
[im 11/212  lung]
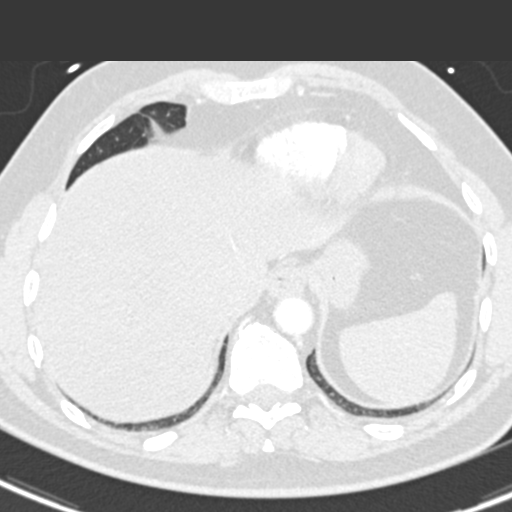
[im 22/212  mediastinal]
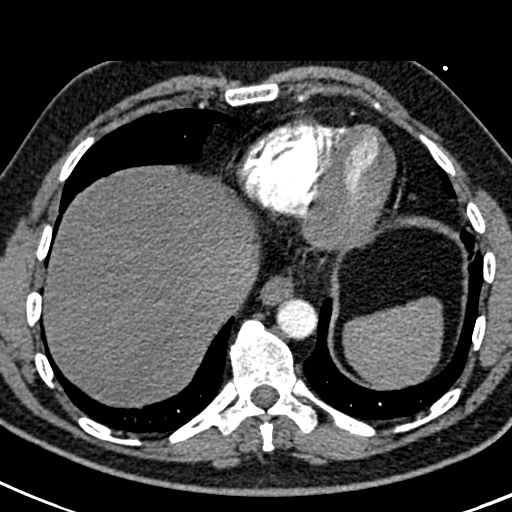
[im 32/212  lung]
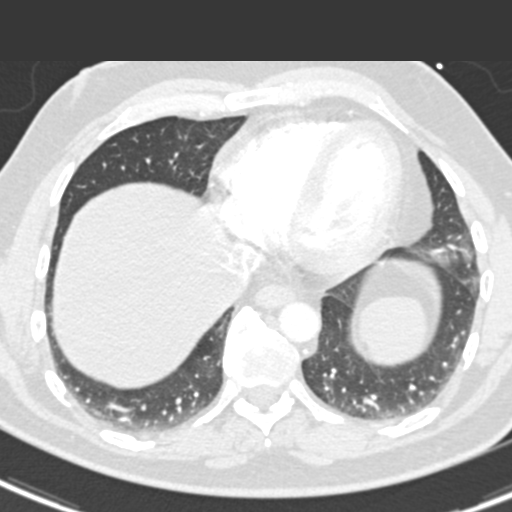
[im 53/212  mediastinal]
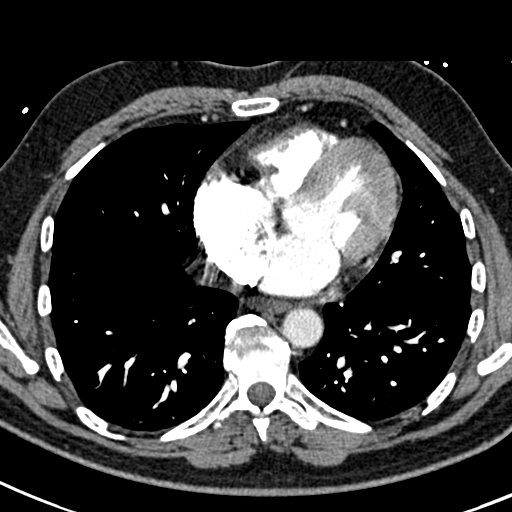
[im 64/212  lung]
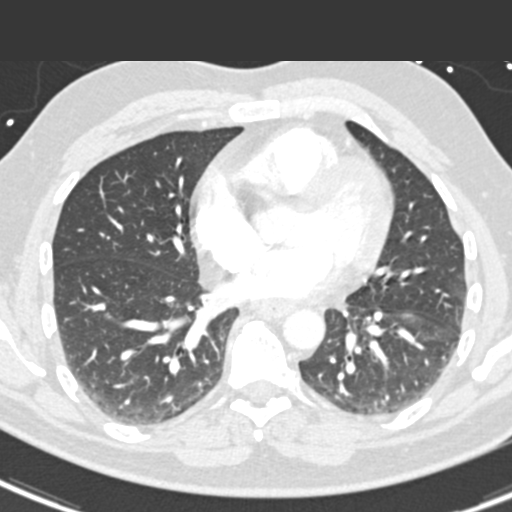
[im 71/212  mediastinal]
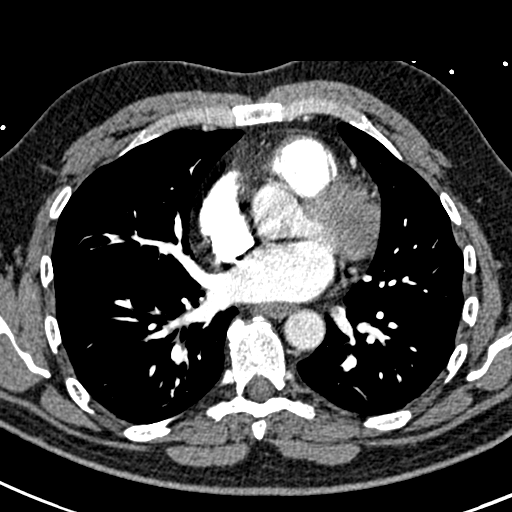
[im 74/212  lung]
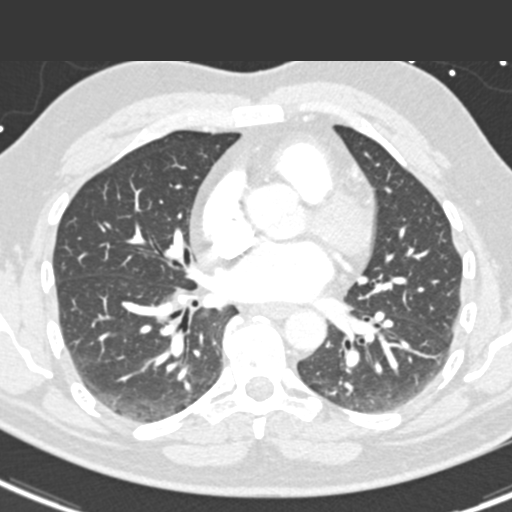
[im 85/212  mediastinal]
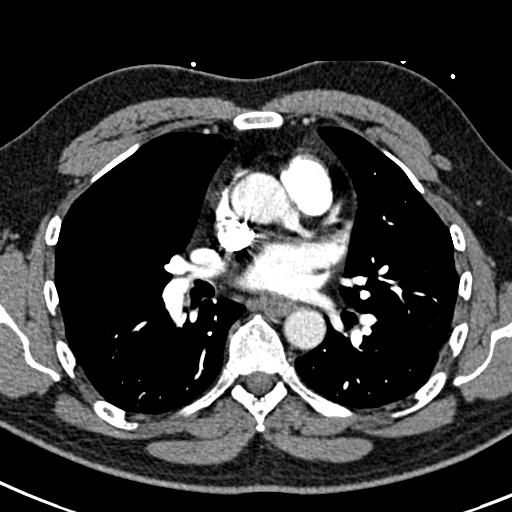
[im 95/212  lung]
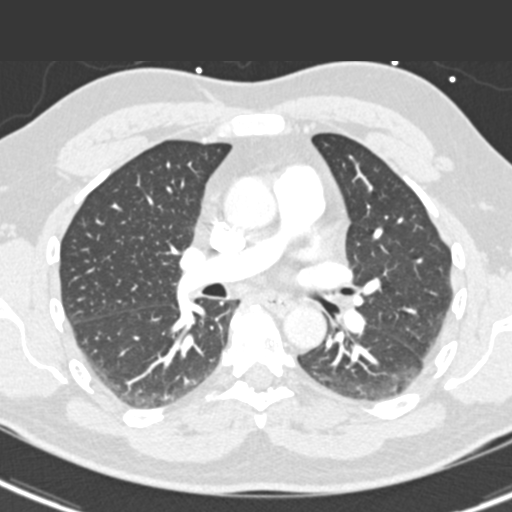
[im 106/212  mediastinal]
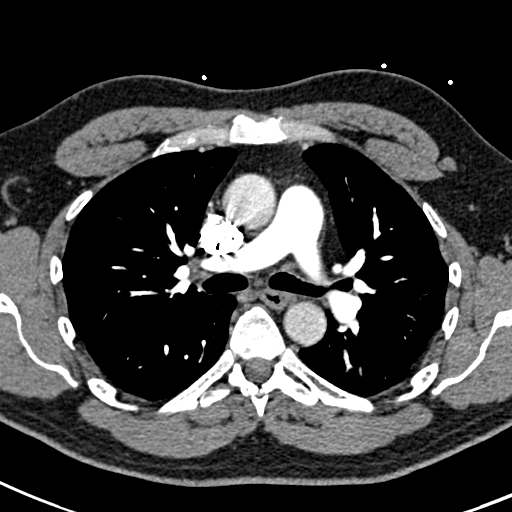
[im 117/212  lung]
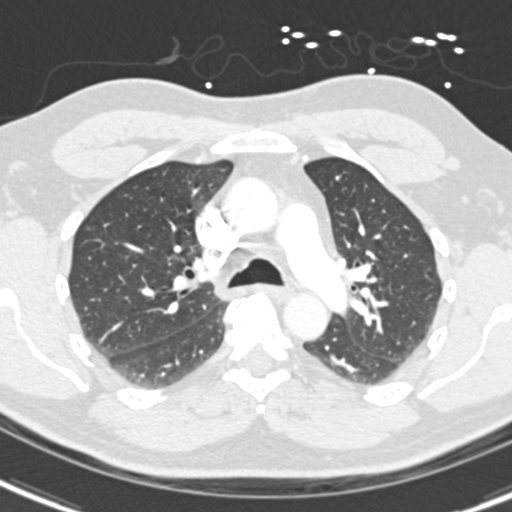
[im 127/212  mediastinal]
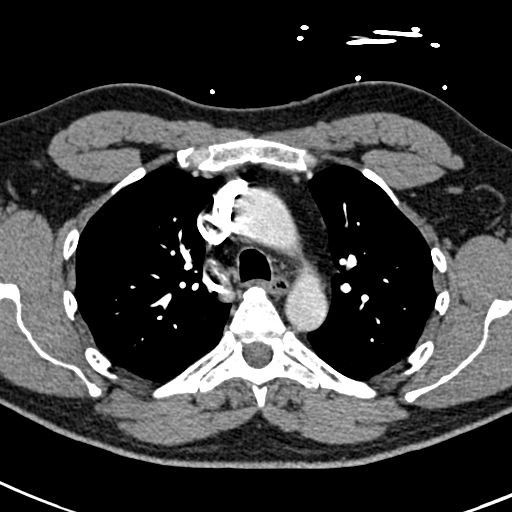
[im 138/212  lung]
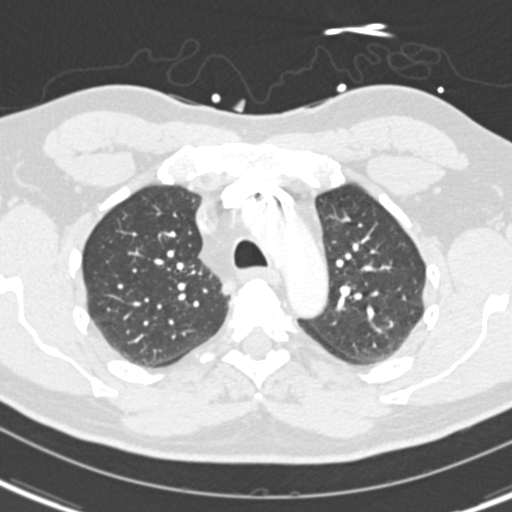
[im 141/212  mediastinal]
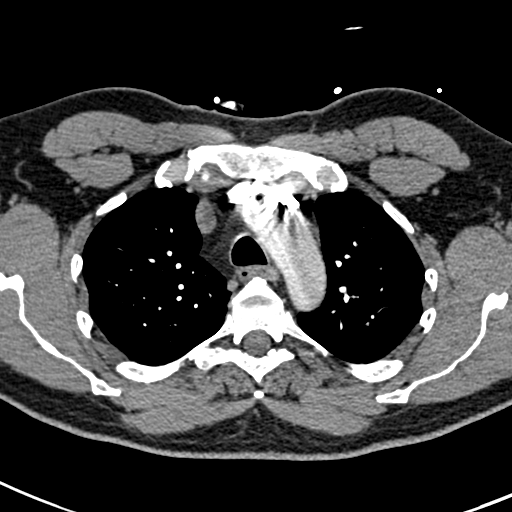
[im 148/212  lung]
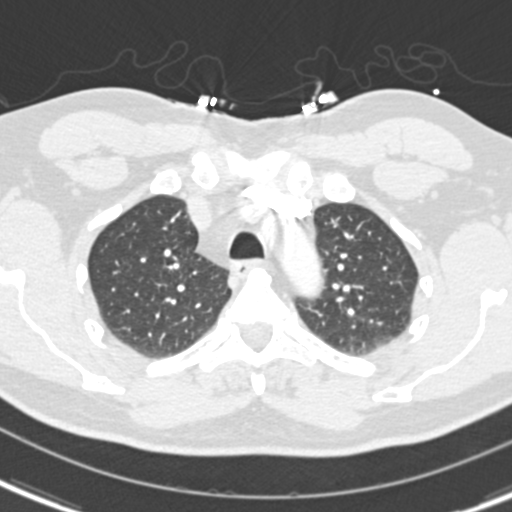
[im 159/212  mediastinal]
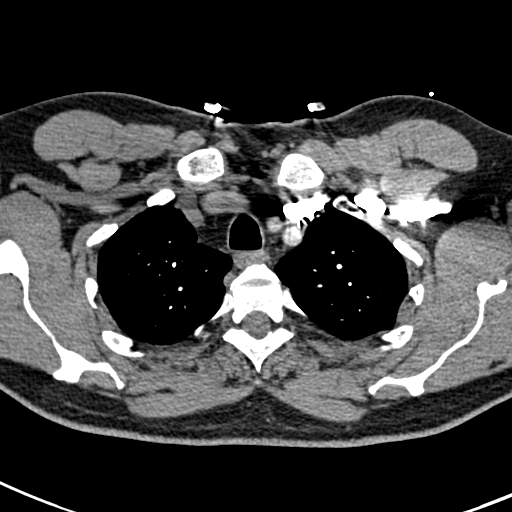
[im 180/212  lung]
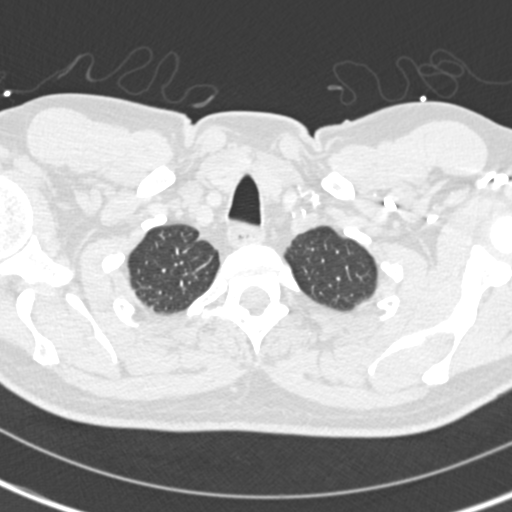
[im 190/212  mediastinal]
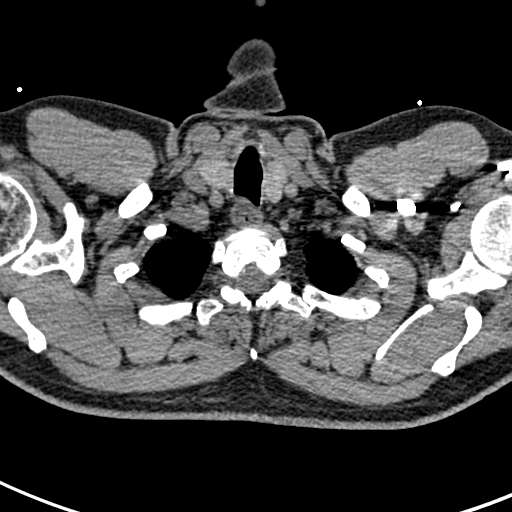
[im 201/212  lung]
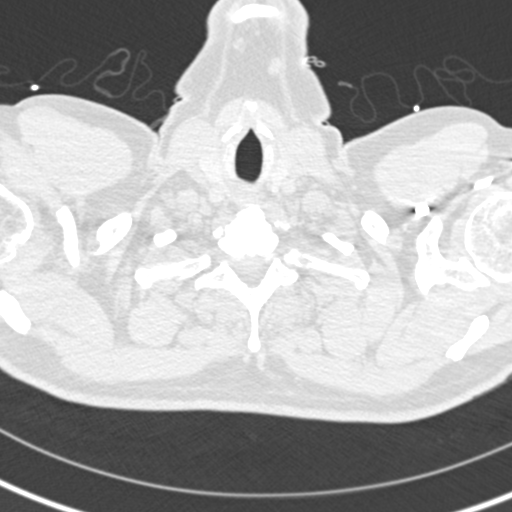

[19 of 32 positions shown; findings below may reference images not displayed]

FINDINGS: Technically adequate study with good opacification of the
central and segmental pulmonary arteries.  No focal filling
defects.  No evidence of significant pulmonary embolus.

Normal heart size.  Normal caliber thoracic aorta.  No evidence of
dissection.  No significant lymphadenopathy in the chest.  The
esophagus is decompressed.  No pleural effusions.  Small accessory
spleen.

Mild dependent atelectasis in the lung bases.  No focal
consolidation or edema.  No significant interstitial changes.
Airways appear patent.  No pneumothorax.  Degenerative changes in
the thoracic spine with normal alignment.
IMPRESSION: No evidence of significant pulmonary embolus.

## 2013-11-29 ENCOUNTER — Emergency Department (HOSPITAL_COMMUNITY)
Admission: EM | Admit: 2013-11-29 | Discharge: 2013-11-30 | Disposition: A | Discharge status: Home / Self Care | Payer: BC Managed Care – PPO | Attending: Emergency Medicine | Admitting: Emergency Medicine

## 2013-11-29 ENCOUNTER — Encounter (HOSPITAL_COMMUNITY): Payer: Self-pay | Admitting: Emergency Medicine

## 2013-11-29 DIAGNOSIS — Z8619 Personal history of other infectious and parasitic diseases: Secondary | ICD-10-CM | POA: Insufficient documentation

## 2013-11-29 DIAGNOSIS — G43909 Migraine, unspecified, not intractable, without status migrainosus: Secondary | ICD-10-CM

## 2013-11-29 DIAGNOSIS — Z8659 Personal history of other mental and behavioral disorders: Secondary | ICD-10-CM | POA: Insufficient documentation

## 2013-11-29 DIAGNOSIS — Z880 Allergy status to penicillin: Secondary | ICD-10-CM | POA: Insufficient documentation

## 2013-11-29 DIAGNOSIS — R011 Cardiac murmur, unspecified: Secondary | ICD-10-CM | POA: Insufficient documentation

## 2013-11-29 MED ORDER — FENTANYL CITRATE 0.05 MG/ML IJ SOLN
50.0000 ug | Freq: Once | INTRAMUSCULAR | Status: DC
  Filled 2013-11-29: qty 2

## 2013-11-29 MED ORDER — KETOROLAC TROMETHAMINE 60 MG/2ML IM SOLN
60.0000 mg | Freq: Once | INTRAMUSCULAR | Status: AC
  Administered 2013-11-29: 60 mg via INTRAMUSCULAR
  Filled 2013-11-29: qty 2

## 2013-11-29 MED ORDER — DIPHENHYDRAMINE HCL 25 MG PO CAPS
25.0000 mg | ORAL_CAPSULE | Freq: Once | ORAL | Status: AC
  Administered 2013-11-29: 25 mg via ORAL
  Filled 2013-11-29: qty 1

## 2013-11-29 MED ORDER — METOCLOPRAMIDE HCL 5 MG/ML IJ SOLN
10.0000 mg | Freq: Once | INTRAMUSCULAR | Status: AC
  Administered 2013-11-29: 10 mg via INTRAMUSCULAR
  Filled 2013-11-29: qty 2

## 2013-11-29 MED ORDER — ONDANSETRON 8 MG PO TBDP
8.0000 mg | ORAL_TABLET | Freq: Once | ORAL | Status: DC
  Filled 2013-11-29: qty 1

## 2013-11-29 NOTE — ED Notes (Signed)
Migraine x 2 days; pt states that the pain is typical of his migraines, pt has taken Naprosyn without relief; pt states that the pain is affecting the vision in rt eye; pt states that he feels like that eye cannot focus as per normal but that happens when he has "severe migraine"; pt states that he is also having N/V; photophobia; pt states that he has not had a migraine this severe in approx 1 yr

## 2013-11-29 NOTE — ED Notes (Signed)
Attempted to medicate pt per protocol orders and pt refused; pt states that he just wants a bed in the back; pt advised that there is a wait and that we would like to help his pain while he waits with out protocol medication; pt continues to decline medication and walks out of triage room; wife states that they will wait in the lobby

## 2013-11-29 NOTE — ED Provider Notes (Signed)
CSN: [id number]     Arrival date & time 11/29/13  1922 History  This chart was scribed for non-physician practitioner, Elpidio Anis, PA-C,working with Flint Melter, MD, by Karle Plumber, ED Scribe.  This patient was seen in room WA03/WA03 and the patient's care was started at 10:22 PM.  Chief Complaint  Patient presents with  . Migraine   The history is provided by the patient. No language interpreter was used.   HPI Comments:  Leonard Holland is a 58 y.o. male with h/o migraines, who presents to the Emergency Department complaining of a severe migraine and labored breathing.  Pt states he has had SOB approximately 1.5-2 years ago with associated HTN. Pt states his breathing started getting worse after onset of the HA. He reports associated nausea and vomiting. He reports taking 2 Aleve several hours ago with moderate relief. Pt states his pain has subsided somewhat now and his breathing has stabilized. He denies abdominal pain, dizziness, or light-headedness. Pt denies smoking.    Past Medical History  Diagnosis Date  . Heart murmur   . Migraines   . Depression   . [facility] spotted fever   . Migraine   . Mitral valve prolapse   . Depression   . Anxiety    Past Surgical History  Procedure Laterality Date  . Hernia repair    . Vasectomy    . Wrist surgery Right     tendon chief release   Family History  Problem Relation Age of Onset  . Cancer Father    History  Substance Use Topics  . Smoking status: Never Smoker   . Smokeless tobacco: Not on file  . Alcohol Use: 0.6 oz/week    1 Cans of beer per week    Review of Systems  Constitutional: Negative for fever.  Eyes: Positive for photophobia and visual disturbance.  Respiratory: Positive for shortness of breath (now resolved).   Gastrointestinal: Positive for nausea and vomiting. Negative for abdominal pain.  Neurological: Positive for headaches.    Allergies  Penicillins  Home Medications   Current  Outpatient Rx  Name  Route  Sig  Dispense  Refill  . naproxen sodium (ANAPROX) 220 MG tablet   Oral   Take 440 mg by mouth 2 (two) times daily as needed (pain).          Triage Vitals: BP 151/82  Pulse 96  Temp(Src) 98.3 F (36.8 C) (Oral)  Resp 18  SpO2 97% Physical Exam  Nursing note and vitals reviewed. Constitutional: He is oriented to person, place, and time. He appears well-developed and well-nourished. No distress.  HENT:  Head: Normocephalic and atraumatic.  Eyes: EOM are normal.  Neck: Normal range of motion.  Cardiovascular: Normal rate, regular rhythm and normal heart sounds.  Exam reveals no gallop and no friction rub.   No murmur heard. Pulmonary/Chest: Effort normal. No respiratory distress. He has no wheezes. He has no rales. He exhibits no tenderness.  Abdominal: Soft. There is no tenderness.  Musculoskeletal: Normal range of motion.  Neurological: He is alert and oriented to person, place, and time. He has normal reflexes. He displays normal reflexes. No cranial nerve deficit (3-12 grossly intact). Coordination normal.  Pt ambulatory without imbalance.   Skin: Skin is warm and dry.  Psychiatric: He has a normal mood and affect. His behavior is normal.    ED Course  Procedures (including critical care time) DIAGNOSTIC STUDIES: Oxygen Saturation is 97% on RA, normal by my  interpretation.   COORDINATION OF CARE: 10:28 PM- Will order Reglan, Benadryl, and Toradol. Pt verbalizes understanding and agrees to plan.  Medications  ondansetron (ZOFRAN-ODT) disintegrating tablet 8 mg (8 mg Oral Not Given 11/29/13 1954)  fentaNYL (SUBLIMAZE) injection 50 mcg (50 mcg Inhalation Not Given 11/29/13 1954)  diphenhydrAMINE (BENADRYL) capsule 25 mg (not administered)  metoCLOPramide (REGLAN) injection 10 mg (not administered)  ketorolac (TORADOL) injection 60 mg (not administered)    Labs Review Labs Reviewed - No data to display Imaging Review No results found.   EKG  Interpretation None      MDM   Final diagnoses:  None    1. Migraine Headache  He is much better with medications in the ED. He reports he is ready for discharge home. VSS. Suspect usual migraine with SOB which has been a contributing symptom in the past.   I personally performed the services described in this documentation, which was scribed in my presence. The recorded information has been reviewed and is accurate.    Arnoldo HookerShari A Waldron Gerry, PA-C 11/30/13 0002

## 2013-11-30 NOTE — ED Notes (Signed)
Pt unable to give urine at this time.

## 2013-11-30 NOTE — Discharge Instructions (Signed)
Migraine Headache A migraine headache is an intense, throbbing pain on one or both sides of your head. A migraine can last for 30 minutes to several hours. CAUSES  The exact cause of a migraine headache is not always known. However, a migraine may be caused when nerves in the brain become irritated and release chemicals that cause inflammation. This causes pain. Certain things may also trigger migraines, such as:  Alcohol.  Smoking.  Stress.  Menstruation.  Aged cheeses.  Foods or drinks that contain nitrates, glutamate, aspartame, or tyramine.  Lack of sleep.  Chocolate.  Caffeine.  Hunger.  Physical exertion.  Fatigue.  Medicines used to treat chest pain (nitroglycerine), birth control pills, estrogen, and some blood pressure medicines. SIGNS AND SYMPTOMS  Pain on one or both sides of your head.  Pulsating or throbbing pain.  Severe pain that prevents daily activities.  Pain that is aggravated by any physical activity.  Nausea, vomiting, or both.  Dizziness.  Pain with exposure to bright lights, loud noises, or activity.  General sensitivity to bright lights, loud noises, or smells. Before you get a migraine, you may get warning signs that a migraine is coming (aura). An aura may include:  Seeing flashing lights.  Seeing bright spots, halos, or zig-zag lines.  Having tunnel vision or blurred vision.  Having feelings of numbness or tingling.  Having trouble talking.  Having muscle weakness. DIAGNOSIS  A migraine headache is often diagnosed based on:  Symptoms.  Physical exam.  A CT scan or MRI of your head. These imaging tests cannot diagnose migraines, but they can help rule out other causes of headaches. TREATMENT Medicines may be given for pain and nausea. Medicines can also be given to help prevent recurrent migraines.  HOME CARE INSTRUCTIONS  Only take over-the-counter or prescription medicines for pain or discomfort as directed by your  health care provider. The use of long-term narcotics is not recommended.  Lie down in a dark, quiet room when you have a migraine.  Keep a journal to find out what may trigger your migraine headaches. For example, write down:  What you eat and drink.  How much sleep you get.  Any change to your diet or medicines.  Limit alcohol consumption.  Quit smoking if you smoke.  Get 7 9 hours of sleep, or as recommended by your health care provider.  Limit stress.  Keep lights dim if bright lights bother you and make your migraines worse. SEEK IMMEDIATE MEDICAL CARE IF:   Your migraine becomes severe.  You have a fever.  You have a stiff neck.  You have vision loss.  You have muscular weakness or loss of muscle control.  You start losing your balance or have trouble walking.  You feel faint or pass out.  You have severe symptoms that are different from your first symptoms. MAKE SURE YOU:   Understand these instructions.  Will watch your condition.  Will get help right away if you are not doing well or get worse. Document Released: 09/14/2005 Document Revised: 07/05/2013 Document Reviewed: 05/22/2013 [employer] Patient Information 2014 [employer].  

## 2013-12-01 NOTE — ED Provider Notes (Signed)
Medical screening examination/treatment/procedure(s) were performed by non-physician practitioner and as supervising physician I was immediately available for consultation/collaboration.   EKG Interpretation None       Evelene Roussin L Tene Gato, MD 12/01/13 0853 

## 2014-03-27 ENCOUNTER — Ambulatory Visit (INDEPENDENT_AMBULATORY_CARE_PROVIDER_SITE_OTHER): Payer: BC Managed Care – PPO | Admitting: Family Medicine

## 2014-03-27 VITALS — BP 118/72 | HR 85 | Temp 98.7°F | Resp 16 | Ht 68.0 in | Wt 188.4 lb

## 2014-03-27 DIAGNOSIS — T63461A Toxic effect of venom of wasps, accidental (unintentional), initial encounter: Secondary | ICD-10-CM

## 2014-03-27 DIAGNOSIS — M7989 Other specified soft tissue disorders: Secondary | ICD-10-CM

## 2014-03-27 DIAGNOSIS — T6391XA Toxic effect of contact with unspecified venomous animal, accidental (unintentional), initial encounter: Secondary | ICD-10-CM

## 2014-03-27 MED ORDER — DOXYCYCLINE HYCLATE 100 MG PO TABS: 100.0000 mg | ORAL_TABLET | Freq: Two times a day (BID) | ORAL | Status: DC

## 2014-03-27 MED ORDER — PREDNISONE 20 MG PO TABS: 40.0000 mg | ORAL_TABLET | Freq: Once | ORAL | Status: DC

## 2014-03-27 NOTE — Patient Instructions (Signed)
Prednisone - 2 tablets once tonight, then if swelling and redness not starting to improve into tomorrow night or next am -  then can start antibiotic as discussed, but infection after these stings is rare. If continuing to swell and redness continues to increase, even on antibiotic - return for recheck.   Over the counter zyrtec once per day, topical cortisone over the counter if needed for itching, and cool compresses ok.   Return to the clinic or go to the nearest emergency room if any of your symptoms worsen or new symptoms occur. Bee, Wasp, or Hornet Sting Your caregiver has diagnosed you as having an insect sting. An insect sting appears as a red lump in the skin that sometimes has a tiny hole in the center, or it may have a stinger in the center of the wound. The most common stings are from wasps, hornets and bees. Individuals have different reactions to insect stings.  A normal reaction may cause pain, swelling, and redness around the sting site.  A localized allergic reaction may cause swelling and redness that extends beyond the sting site.  A large local reaction may continue to develop over the next 12 to 36 hours.  On occasion, the reactions can be severe (anaphylactic reaction). An anaphylactic reaction may cause wheezing; difficulty breathing; chest pain; fainting; raised, itchy, red patches on the skin; a sick feeling to your stomach (nausea); vomiting; cramping; or diarrhea. If you have had an anaphylactic reaction to an insect sting in the past, you are more likely to have one again. HOME CARE INSTRUCTIONS   With bee stings, a small sac of poison is left in the wound. Brushing across this with something such as a credit card, or anything similar, will help remove this and decrease the amount of the reaction. This same procedure will not help a wasp sting as they do not leave behind a stinger and poison sac.  Apply a cold compress for 10 to 20 minutes every hour for 1 to 2 days,  depending on severity, to reduce swelling and itching.  To lessen pain, a paste made of water and baking soda may be rubbed on the bite or sting and left on for 5 minutes.  To relieve itching and swelling, you may use take medication or apply medicated creams or lotions as directed.  Only take over-the-counter or prescription medicines for pain, discomfort, or fever as directed by your caregiver.  Wash the sting site daily with soap and water. Apply antibiotic ointment on the sting site as directed.  If you suffered a severe reaction:  If you did not require hospitalization, an adult will need to stay with you for 24 hours in case the symptoms return.  You may need to wear a medical bracelet or necklace stating the allergy.  You and your family need to learn when and how to use an anaphylaxis kit or epinephrine injection.  If you have had a severe reaction before, always carry your anaphylaxis kit with you. SEEK MEDICAL CARE IF:   None of the above helps within 2 to 3 days.  The area becomes red, warm, tender, and swollen beyond the area of the bite or sting.  You have an oral temperature above 102 F (38.9 C). SEEK IMMEDIATE MEDICAL CARE IF:  You have symptoms of an allergic reaction which are:  Wheezing.  Difficulty breathing.  Chest pain.  Lightheadedness or fainting.  Itchy, raised, red patches on the skin.  Nausea, vomiting, cramping or  diarrhea. ANY OF THESE SYMPTOMS MAY REPRESENT A SERIOUS PROBLEM THAT IS AN EMERGENCY. Do not wait to see if the symptoms will go away. Get medical help right away. Call your local emergency services (911 in [place] DO NOT drive yourself to the hospital. MAKE SURE YOU:   Understand these instructions.  Will watch your condition.  Will get help right away if you are not doing well or get worse. Document Released: 09/14/2005 Document Revised: 12/07/2011 Document Reviewed: 03/01/2010 [facility] Patient Information 2015 [place], [place].  This information is not intended to replace advice given to you by your health care Cyntia Staley. Make sure you discuss any questions you have with your health care Worthy Boschert.

## 2014-03-27 NOTE — Progress Notes (Signed)
Subjective:  This chart was scribed for Leonard Ray, MD by Leonard Holland, Medical Scribe. This patient was seen in Room 8 and the patient's care was started at 7:19 PM.   Patient ID: Leonard Holland, male    DOB: 01-26-1956, 58 y.o.   MRN: [id number]  HPI There are no active problems to display for this patient.  HPI Comments: Leonard Holland is a 58 y.o. male who presents to the [facility] complaining of painful yellow jacket stings located on his right knee with associated itchiness, redness, and swelling to his lower left leg that he incurred 2 days ago while mowing the lawn.  He believes that he may have been stung by 3 yellow jackets.  He denies fever, chills, rash, SOB, trouble swallowing, and trouble extending and flexing his left leg as associated symptoms.  He states that he took 2 doses of Benadryl PO and applied Benadryl cream when he first incurred these stings.  He denies taking Benadryl today.  The patient states that he has been stung by a yellow jacket in the past but has never experienced this amount of swelling and redness.   Past Medical History  Diagnosis Date  . Heart murmur   . Migraines   . Depression   . [facility] spotted fever   . Migraine   . Mitral valve prolapse   . Depression   . Anxiety    Past Surgical History  Procedure Laterality Date  . Hernia repair    . Vasectomy    . Wrist surgery Right     tendon chief release   Allergies  Allergen Reactions  . Penicillins Other (See Comments)    convulsions   Prior to Admission medications   Not on File   History   Social History  . Marital Status: Married    Spouse Name: N/A    Number of Children: N/A  . Years of Education: N/A   Occupational History  . Not on file.   Social History Main Topics  . Smoking status: Never Smoker   . Smokeless tobacco: Not on file  . Alcohol Use: 0.6 oz/week    1 Cans of beer per week  . Drug Use: No  . Sexual Activity: Yes    Birth  Control/ Protection: None   Other Topics Concern  . Not on file   Social History Narrative  . No narrative on file       Review of Systems  Constitutional: Negative for fever and chills.  HENT: Negative for trouble swallowing.   Respiratory: Negative for shortness of breath.   Musculoskeletal: Positive for joint swelling. Negative for arthralgias and gait problem.  Skin: Positive for color change and wound. Negative for rash.       Objective:   Physical Exam  Vitals reviewed. Constitutional: He is oriented to person, place, and time. He appears well-developed and well-nourished.  HENT:  Head: Normocephalic and atraumatic.  Right Ear: Tympanic membrane, external ear and ear canal normal.  Left Ear: Tympanic membrane, external ear and ear canal normal.  Nose: No rhinorrhea.  Mouth/Throat: Oropharynx is clear and moist and mucous membranes are normal. No oropharyngeal exudate or posterior oropharyngeal erythema.  Eyes: Conjunctivae are normal. Pupils are equal, round, and reactive to light.  Neck: Neck supple.  Cardiovascular: Normal rate, regular rhythm, normal heart sounds and intact distal pulses.  Exam reveals no gallop and no friction rub.   No murmur heard. Pulses:  Dorsalis pedis pulses are 2+ on the left side.  Pulmonary/Chest: Effort normal and breath sounds normal. No respiratory distress. He has no decreased breath sounds. He has no wheezes. He has no rhonchi. He has no rales.  Abdominal: Soft. There is no tenderness.  Musculoskeletal:       Left knee: He exhibits erythema.       Left lower leg: He exhibits edema.  Left knee - large patch of erythema on the medial aspect of the knee to mid thigh just below the joint line.  No saphenous tenderness medially.  Dependent edema into the left lower leg.   Lymphadenopathy:    He has no cervical adenopathy.  Neurological: He is alert and oriented to person, place, and time. He has normal reflexes.   NVI distally.      Skin: Skin is warm and dry. No rash noted.  Psychiatric: He has a normal mood and affect. His behavior is normal.   Filed Vitals:   03/27/14 1809  BP: 118/72  Pulse: 85  Temp: 98.7 F (37.1 C)  TempSrc: Oral  Resp: 16  Height: 5' 8" (1.727 m)  Weight: 188 lb 6.4 oz (85.458 kg)  SpO2: 97%          Assessment & Plan:   Leonard Holland is a 58 y.o. male Yellow jacket sting, accidental or unintentional, initial encounter - Plan: doxycycline (VIBRA-TABS) 100 MG tablet, predniSONE (DELTASONE) 20 MG tablet  Left leg swelling - Plan: doxycycline (VIBRA-TABS) 100 MG tablet, predniSONE (DELTASONE) 20 MG tablet  Yellow jacket stings with suspected large local reaction. Prednisone 74m x1, zyrtec QD and sx care.  Outlined area of erythema.  If not improving in next 1-2 days as would be expected with LLR, can start doxycycline for possible cellulitis.  RTC precautions discussed.   Meds ordered this encounter  Medications  . doxycycline (VIBRA-TABS) 100 MG tablet    Sig: Take 1 tablet (100 mg total) by mouth 2 (two) times daily.    Dispense:  20 tablet    Refill:  0  . predniSONE (DELTASONE) 20 MG tablet    Sig: Take 2 tablets (40 mg total) by mouth once.    Dispense:  2 tablet    Refill:  0   Patient Instructions  Prednisone - 2 tablets once tonight, then if swelling and redness not starting to improve into tomorrow night or next am -  then can start antibiotic as discussed, but infection after these stings is rare. If continuing to swell and redness continues to increase, even on antibiotic - return for recheck.   Over the counter zyrtec once per day, topical cortisone over the counter if needed for itching, and cool compresses ok.   Return to the clinic or go to the nearest emergency room if any of your symptoms worsen or new symptoms occur. Bee, Wasp, or Hornet Sting Your caregiver has diagnosed you as having an insect sting. An insect sting appears as a red lump in the skin that  sometimes has a tiny hole in the center, or it may have a stinger in the center of the wound. The most common stings are from wasps, hornets and bees. Individuals have different reactions to insect stings.  A normal reaction may cause pain, swelling, and redness around the sting site.  A localized allergic reaction may cause swelling and redness that extends beyond the sting site.  A large local reaction may continue to develop over the next 12 to 36  hours.  On occasion, the reactions can be severe (anaphylactic reaction). An anaphylactic reaction may cause wheezing; difficulty breathing; chest pain; fainting; raised, itchy, red patches on the skin; a sick feeling to your stomach (nausea); vomiting; cramping; or diarrhea. If you have had an anaphylactic reaction to an insect sting in the past, you are more likely to have one again. HOME CARE INSTRUCTIONS   With bee stings, a small sac of poison is left in the wound. Brushing across this with something such as a credit card, or anything similar, will help remove this and decrease the amount of the reaction. This same procedure will not help a wasp sting as they do not leave behind a stinger and poison sac.  Apply a cold compress for 10 to 20 minutes every hour for 1 to 2 days, depending on severity, to reduce swelling and itching.  To lessen pain, a paste made of water and baking soda may be rubbed on the bite or sting and left on for 5 minutes.  To relieve itching and swelling, you may use take medication or apply medicated creams or lotions as directed.  Only take over-the-counter or prescription medicines for pain, discomfort, or fever as directed by your caregiver.  Wash the sting site daily with soap and water. Apply antibiotic ointment on the sting site as directed.  If you suffered a severe reaction:  If you did not require hospitalization, an adult will need to stay with you for 24 hours in case the symptoms return.  You may need  to wear a medical bracelet or necklace stating the allergy.  You and your family need to learn when and how to use an anaphylaxis kit or epinephrine injection.  If you have had a severe reaction before, always carry your anaphylaxis kit with you. SEEK MEDICAL CARE IF:   None of the above helps within 2 to 3 days.  The area becomes red, warm, tender, and swollen beyond the area of the bite or sting.  You have an oral temperature above 102 F (38.9 C). SEEK IMMEDIATE MEDICAL CARE IF:  You have symptoms of an allergic reaction which are:  Wheezing.  Difficulty breathing.  Chest pain.  Lightheadedness or fainting.  Itchy, raised, red patches on the skin.  Nausea, vomiting, cramping or diarrhea. ANY OF THESE SYMPTOMS MAY REPRESENT A SERIOUS PROBLEM THAT IS AN EMERGENCY. Do not wait to see if the symptoms will go away. Get medical help right away. Call your local emergency services (911 in [place] DO NOT drive yourself to the hospital. MAKE SURE YOU:   Understand these instructions.  Will watch your condition.  Will get help right away if you are not doing well or get worse. Document Released: 09/14/2005 Document Revised: 12/07/2011 Document Reviewed: 03/01/2010 [facility] Patient Information 2015 [place], [place]. This information is not intended to replace advice given to you by your health care Aydian Dimmick. Make sure you discuss any questions you have with your health care Borden Thune.       I personally performed the services described in this documentation, which was scribed in my presence. The recorded information has been reviewed and considered, and addended by me as needed.

## 2016-06-30 ENCOUNTER — Ambulatory Visit (INDEPENDENT_AMBULATORY_CARE_PROVIDER_SITE_OTHER): Payer: BLUE CROSS/BLUE SHIELD | Admitting: Family Medicine

## 2016-06-30 VITALS — BP 124/80 | HR 93 | Temp 98.7°F | Resp 17 | Ht 69.0 in | Wt 190.0 lb

## 2016-06-30 DIAGNOSIS — R509 Fever, unspecified: Secondary | ICD-10-CM | POA: Diagnosis not present

## 2016-06-30 DIAGNOSIS — J029 Acute pharyngitis, unspecified: Secondary | ICD-10-CM | POA: Diagnosis not present

## 2016-06-30 LAB — POCT RAPID STREP A (OFFICE): Rapid Strep A Screen: NEGATIVE

## 2016-06-30 NOTE — Progress Notes (Addendum)
Subjective:  By signing my name below, I, Stann Ore, attest that this documentation has been prepared under the direction and in the presence of Meredith Staggers, MD. Electronically Signed: Stann Ore, Scribe. 06/30/2016 , 9:05 AM .  Patient was seen in Room 10 .   Patient ID: Leonard Holland, male    DOB: 1956-02-13, 60 y.o.   MRN: [id number] Chief Complaint  Patient presents with  . Sore Throat   HPI Leonard Holland is a 59 y.o. male  Patient states he noticed sore throat with sinus congestion and headache that started 3 days ago. He's been feeling fatigue for the past few days with minimal cough and soreness along the sides of his throat. He measured a fever last night (tmax 101). His wife is a Runner, broadcasting/film/video that teaches 1st grade. He denies any known sick contact.   There are no active problems to display for this patient.  Past Medical History:  Diagnosis Date  . Anxiety   . Depression   . Depression   . Heart murmur   . Migraine   . Migraines   . Mitral valve prolapse   . [place] spotted fever    Past Surgical History:  Procedure Laterality Date  . HERNIA REPAIR    . VASECTOMY    . WRIST SURGERY Right    tendon chief release   Allergies  Allergen Reactions  . Penicillins Other (See Comments)    convulsions   Prior to Admission medications   Not on File   Social History   Social History  . Marital status: Married    Spouse name: N/A  . Number of children: N/A  . Years of education: N/A   Occupational History  . Not on file.   Social History Main Topics  . Smoking status: Never Smoker  . Smokeless tobacco: Not on file  . Alcohol use 0.6 oz/week    1 Cans of beer per week  . Drug use: No  . Sexual activity: Yes    Birth control/ protection: None   Other Topics Concern  . Not on file   Social History Narrative  . No narrative on file   Review of Systems  Constitutional: Positive for activity change, fatigue and fever. Negative for chills.    HENT: Positive for congestion, sinus pressure, sore throat and voice change.   Respiratory: Positive for cough. Negative for shortness of breath and wheezing.   Neurological: Positive for headaches.  Hematological: Positive for adenopathy.       Objective:   Physical Exam  Constitutional: He is oriented to person, place, and time. He appears well-developed and well-nourished.  HENT:  Head: Normocephalic and atraumatic.  Right Ear: Tympanic membrane, external ear and ear canal normal.  Left Ear: Tympanic membrane, external ear and ear canal normal.  Nose: No rhinorrhea.  Mouth/Throat: Mucous membranes are normal. Posterior oropharyngeal erythema (minimal) present. No oropharyngeal exudate.  Eyes: Conjunctivae are normal. Pupils are equal, round, and reactive to light.  Neck: Neck supple.  Tender along the SCM muscle and anterior but no palpable nodes  Cardiovascular: Normal rate, regular rhythm, normal heart sounds and intact distal pulses.   No murmur heard. Pulmonary/Chest: Effort normal and breath sounds normal. He has no wheezes. He has no rhonchi. He has no rales.  Abdominal: Soft. There is no tenderness.  Lymphadenopathy:    He has no cervical adenopathy.  Neurological: He is alert and oriented to person, place, and time.  Skin: Skin  is warm and dry. No rash noted.  Psychiatric: He has a normal mood and affect. His behavior is normal.  Vitals reviewed.   Vitals:   06/30/16 0759  BP: 124/80  Pulse: 93  Resp: 17  Temp: 98.7 F (37.1 C)  TempSrc: Oral  SpO2: 97%  Weight: 190 lb (86.2 kg)  Height: 5\' 9"  (1.753 m)   Results for orders placed or performed in visit on 06/30/16  POCT rapid strep A  Result Value Ref Range   Rapid Strep A Screen Negative Negative        Assessment & Plan:  Leonard Holland is a 60 y.o. male Sore throat - Plan: POCT rapid strep A, Culture, Group A Strep  Fever, unspecified fever cause - Plan: POCT rapid strep A, Culture, Group A  Strep Suspected early viral illness. Symptomatic care discussed, check throat culture, RTC precautions. Note out of work today given.  No orders of the defined types were placed in this encounter.  Patient Instructions   Although you did have a fever last night, your current exam does not appear to be strep throat. I will check a strep test as well as a throat culture to make sure. If either of those are positive, I will call you and call in antibiotics. For now, Tylenol or Motrin, Cepacol or other cough drops/sore throat lozenges, drink plenty of fluids, and rest.  Return to the clinic or go to the nearest emergency room if any of your symptoms worsen or new symptoms occur.   Sore Throat A sore throat is pain, burning, irritation, or scratchiness of the throat. There is often pain or tenderness when swallowing or talking. A sore throat may be accompanied by other symptoms, such as coughing, sneezing, fever, and swollen neck glands. A sore throat is often the first sign of another sickness, such as a cold, flu, strep throat, or mononucleosis (commonly known as mono). Most sore throats go away without medical treatment. CAUSES  The most common causes of a sore throat include:  A viral infection, such as a cold, flu, or mono.  A bacterial infection, such as strep throat, tonsillitis, or whooping cough.  Seasonal allergies.  Dryness in the air.  Irritants, such as smoke or pollution.  Gastroesophageal reflux disease (GERD). HOME CARE INSTRUCTIONS   Only take over-the-counter medicines as directed by your caregiver.  Drink enough fluids to keep your urine clear or pale yellow.  Rest as needed.  Try using throat sprays, lozenges, or sucking on hard candy to ease any pain (if older than 4 years or as directed).  Sip warm liquids, such as broth, herbal tea, or warm water with honey to relieve pain temporarily. You may also eat or drink cold or frozen liquids such as frozen ice  pops.  Gargle with salt water (mix 1 tsp salt with 8 oz of water).  Do not smoke and avoid secondhand smoke.  Put a cool-mist humidifier in your bedroom at night to moisten the air. You can also turn on a hot shower and sit in the bathroom with the door closed for 5-10 minutes. SEEK IMMEDIATE MEDICAL CARE IF:  You have difficulty breathing.  You are unable to swallow fluids, soft foods, or your saliva.  You have increased swelling in the throat.  Your sore throat does not get better in 7 days.  You have nausea and vomiting.  You have a fever or persistent symptoms for more than 2-3 days.  You have a fever  and your symptoms suddenly get worse. MAKE SURE YOU:   Understand these instructions.  Will watch your condition.  Will get help right away if you are not doing well or get worse.   This information is not intended to replace advice given to you by your health care provider. Make sure you discuss any questions you have with your health care provider.   Document Released: 10/22/2004 Document Revised: 10/05/2014 Document Reviewed: 05/22/2012 Elsevier Interactive Patient Education 2016 [employer]     IF you received an x-ray today, you will receive an invoice from [facility]. Please contact [facility] at [phone] with questions or concerns regarding your invoice.   IF you received labwork today, you will receive an invoice from [facility]. Please contact [facility] at [phone] with questions or concerns regarding your invoice.   Our billing staff will not be able to assist you with questions regarding bills from these companies.  You will be contacted with the lab results as soon as they are available. The fastest way to get your results is to activate your My Chart account. Instructions are located on the last page of this paperwork. If you have not heard from us regarding the results in 2 weeks, please contact this  office.        I personally performed the services described in this documentation, which was scribed in my presence. The recorded information has been reviewed and considered, and addended by me as needed.   Signed,   Meredith StaggersJeffrey Journi Moffa, MD [facility].  06/30/16 9:48 AM

## 2016-06-30 NOTE — Patient Instructions (Addendum)
Although you did have a fever last night, your current exam does not appear to be strep throat. I will check a strep test as well as a throat culture to make sure. If either of those are positive, I will call you and call in antibiotics. For now, Tylenol or Motrin, Cepacol or other cough drops/sore throat lozenges, drink plenty of fluids, and rest.  Return to the clinic or go to the nearest emergency room if any of your symptoms worsen or new symptoms occur.   Sore Throat A sore throat is pain, burning, irritation, or scratchiness of the throat. There is often pain or tenderness when swallowing or talking. A sore throat may be accompanied by other symptoms, such as coughing, sneezing, fever, and swollen neck glands. A sore throat is often the first sign of another sickness, such as a cold, flu, strep throat, or mononucleosis (commonly known as mono). Most sore throats go away without medical treatment. CAUSES  The most common causes of a sore throat include:  A viral infection, such as a cold, flu, or mono.  A bacterial infection, such as strep throat, tonsillitis, or whooping cough.  Seasonal allergies.  Dryness in the air.  Irritants, such as smoke or pollution.  Gastroesophageal reflux disease (GERD). HOME CARE INSTRUCTIONS   Only take over-the-counter medicines as directed by your caregiver.  Drink enough fluids to keep your urine clear or pale yellow.  Rest as needed.  Try using throat sprays, lozenges, or sucking on hard candy to ease any pain (if older than 4 years or as directed).  Sip warm liquids, such as broth, herbal tea, or warm water with honey to relieve pain temporarily. You may also eat or drink cold or frozen liquids such as frozen ice pops.  Gargle with salt water (mix 1 tsp salt with 8 oz of water).  Do not smoke and avoid secondhand smoke.  Put a cool-mist humidifier in your bedroom at night to moisten the air. You can also turn on a hot shower and sit in the  bathroom with the door closed for 5-10 minutes. SEEK IMMEDIATE MEDICAL CARE IF:  You have difficulty breathing.  You are unable to swallow fluids, soft foods, or your saliva.  You have increased swelling in the throat.  Your sore throat does not get better in 7 days.  You have nausea and vomiting.  You have a fever or persistent symptoms for more than 2-3 days.  You have a fever and your symptoms suddenly get worse. MAKE SURE YOU:   Understand these instructions.  Will watch your condition.  Will get help right away if you are not doing well or get worse.   This information is not intended to replace advice given to you by your health care provider. Make sure you discuss any questions you have with your health care provider.   Document Released: 10/22/2004 Document Revised: 10/05/2014 Document Reviewed: 05/22/2012 Elsevier Interactive Patient Education 2016 [employer]     IF you received an x-ray today, you will receive an invoice from [facility]. Please contact [facility] at [phone] with questions or concerns regarding your invoice.   IF you received labwork today, you will receive an invoice from [facility]. Please contact [facility] at [phone] with questions or concerns regarding your invoice.   Our billing staff will not be able to assist you with questions regarding bills from these companies.  You will be contacted with the lab results as soon as they  are available. The fastest way to get your results is to activate your My Chart account. Instructions are located on the last page of this paperwork. If you have not heard from [place] regarding the results in 2 weeks, please contact this office.

## 2016-07-01 LAB — CULTURE, GROUP A STREP: Organism ID, Bacteria: NORMAL

## 2017-05-06 ENCOUNTER — Encounter: Payer: Self-pay | Admitting: Family Medicine

## 2017-05-06 ENCOUNTER — Ambulatory Visit (INDEPENDENT_AMBULATORY_CARE_PROVIDER_SITE_OTHER): Payer: BLUE CROSS/BLUE SHIELD | Admitting: Family Medicine

## 2017-05-06 VITALS — BP 124/82 | HR 84 | Temp 98.0°F | Resp 16 | Ht 69.0 in | Wt 194.0 lb

## 2017-05-06 DIAGNOSIS — H6981 Other specified disorders of Eustachian tube, right ear: Secondary | ICD-10-CM | POA: Diagnosis not present

## 2017-05-06 NOTE — Progress Notes (Signed)
By signing my name below, I, Leonard Holland, attest that this documentation has been prepared under the direction and in the presence of Meredith StaggersJeffrey Angeleah Labrake, MD.  Electronically Signed: Arvilla MarketMesha Holland, Medical Scribe. 05/06/17. 4:21 PM.  Subjective:    Patient ID: Leonard Holland, male    DOB: Sep 08, 1956, 61 y.o.   MRN: [id number]  HPI Chief Complaint  Patient presents with  . Ear Fullness    congestion    HPI Comments: Leonard Holland is a 61 y.o. male who [facility] complaining of right ear fullness for the past 2 weeks. Reports associated sxs of a little bit of post nasal drainage, muffled hearing, and tinnitus especially in the morning. Pt finds temporarily relief of congestion with auto-insufflation. Pt was flying in a plane 2.5 weeks ago, but noticed current sxs a couple of days after. Pt has never had to use any form of allergy relief. Denies nasal congestion.  There are no active problems to display for this patient.  Past Medical History:  Diagnosis Date  . Anxiety   . Depression   . Depression   . Heart murmur   . Migraine   . Migraines   . Mitral valve prolapse   . [place] spotted fever    Past Surgical History:  Procedure Laterality Date  . HERNIA REPAIR    . VASECTOMY    . WRIST SURGERY Right    tendon chief release   Allergies  Allergen Reactions  . Penicillins Other (See Comments)    convulsions   Prior to Admission medications   Not on File   Social History   Social History  . Marital status: Married    Spouse name: N/A  . Number of children: N/A  . Years of education: N/A   Occupational History  . Not on file.   Social History Main Topics  . Smoking status: Never Smoker  . Smokeless tobacco: Never Used  . Alcohol use 0.6 oz/week    1 Cans of beer per week  . Drug use: No  . Sexual activity: Yes    Birth control/ protection: None   Other Topics Concern  . Not on file   Social History Narrative  . No narrative  on file   Review of Systems  HENT: Positive for congestion, hearing loss, postnasal drip and tinnitus.    Objective:  Physical Exam  Constitutional: He appears well-developed and well-nourished. No distress.  HENT:  Head: Normocephalic and atraumatic.  Mouth/Throat: No oropharyngeal exudate or posterior oropharyngeal erythema.  Moderate cerumen but does not appear obstructed Visualized TM slightly dull but no erythema Possible clear fluid at that base Weber test localizes to the right Rinne' Test: 8 seconds with bone conduction and 10 seconds with air conduction  Repeat with 7 sec bone, and 9 sec air  Eyes: Conjunctivae are normal.  Neck: Neck supple.  Cardiovascular: Normal rate.   Pulmonary/Chest: Effort normal.  Neurological: He is alert.  Skin: Skin is warm and dry.  Psychiatric: He has a normal mood and affect. His behavior is normal.  Nursing note and vitals reviewed.   Vitals:   05/06/17 1555  BP: 124/82  Pulse: 84  Resp: 16  Temp: 98 F (36.7 C)  TempSrc: Oral  SpO2: 98%  Weight: 194 lb (88 kg)  Height: 5\' 9"  (1.753 m)   Body mass index is 28.65 kg/m. Assessment & Plan:   Leonard Holland is a 61 y.o. male Dysfunction of  right eustachian tube  - Conductive loss on exam, and ability to clear with insufflation is reassuring. Suspect eustachian tube dysfunction, possibly from recent air travel.  -Trial of Flonase, systemic decongestant such as Sudafed, stop if new side effects. Gentle auto insufflation.  If decreased hearing persists, could consider ENT eval in the next week to 10 days. RTC precautions if worsening.  No orders of the defined types were placed in this encounter.  Patient Instructions    Your ear blockage may be due to fluid behind the ear or eustachian tube dysfunction. This can sometime occur after air travel. Testing in the office also appears consistent with conductive hearing loss.   You can try flonase nasal spray, low dose decongestant such  as sudafed.  If not improving in next week or persistent hearing loss, I can refer you to ENT. Let me know.    Eustachian Tube Dysfunction The eustachian tube connects the middle ear to the back of the nose. It regulates air pressure in the middle ear by allowing air to move between the ear and nose. It also helps to drain fluid from the middle ear space. When the eustachian tube does not function properly, air pressure, fluid, or both can build up in the middle ear. Eustachian tube dysfunction can affect one or both ears. What are the causes? This condition happens when the eustachian tube becomes blocked or cannot open normally. This may result from:  Ear infections.  Colds and other upper respiratory infections.  Allergies.  Irritation, such as from cigarette smoke or acid from the stomach coming up into the esophagus (gastroesophageal reflux).  Sudden changes in air pressure, such as from descending in an airplane.  Abnormal growths in the nose or throat, such as nasal polyps, tumors, or enlarged tissue at the back of the throat (adenoids).  What increases the risk? This condition may be more likely to develop in people who smoke and people who are overweight. Eustachian tube dysfunction may also be more likely to develop in children, especially children who have:  Certain birth defects of the mouth, such as cleft palate.  Large tonsils and adenoids.  What are the signs or symptoms? Symptoms of this condition may include:  A feeling of fullness in the ear.  Ear pain.  Clicking or popping noises in the ear.  Ringing in the ear.  Hearing loss.  Loss of balance.  Symptoms may get worse when the air pressure around you changes, such as when you travel to an area of high elevation or fly on an airplane. How is this diagnosed? This condition may be diagnosed based on:  Your symptoms.  A physical exam of your ear, nose, and throat.  Tests, such as those that  measure: ? The movement of your eardrum (tympanogram). ? Your hearing (audiometry).  How is this treated? Treatment depends on the cause and severity of your condition. If your symptoms are mild, you may be able to relieve your symptoms by moving air into ("popping") your ears. If you have symptoms of fluid in your ears, treatment may include:  Decongestants.  Antihistamines.  Nasal sprays or ear drops that contain medicines that reduce swelling (steroids).  In some cases, you may need to have a procedure to drain the fluid in your eardrum (myringotomy). In this procedure, a small tube is placed in the eardrum to:  Drain the fluid.  Restore the air in the middle ear space.  Follow these instructions at home:  Take over-the-counter  and prescription medicines only as told by your health care provider.  Use techniques to help pop your ears as recommended by your health care provider. These may include: ? Chewing gum. ? Yawning. ? Frequent, forceful swallowing. ? Closing your mouth, holding your nose closed, and gently blowing as if you are trying to blow air out of your nose.  Do not do any of the following until your health care provider approves: ? Travel to high altitudes. ? Fly in airplanes. ? Work in a Estate agent or room. ? Scuba dive.  Keep your ears dry. Dry your ears completely after showering or bathing.  Do not smoke.  Keep all follow-up visits as told by your health care provider. This is important. Contact a health care provider if:  Your symptoms do not go away after treatment.  Your symptoms come back after treatment.  You are unable to pop your ears.  You have: ? A fever. ? Pain in your ear. ? Pain in your head or neck. ? Fluid draining from your ear.  Your hearing suddenly changes.  You become very dizzy.  You lose your balance. This information is not intended to replace advice given to you by your health care provider. Make sure you  discuss any questions you have with your health care provider. Document Released: 10/11/2015 Document Revised: 02/20/2016 Document Reviewed: 10/03/2014 Elsevier Interactive Patient Education  2018 [employer].    IF you received an x-ray today, you will receive an invoice from [facility]. Please contact [facility] at [phone] with questions or concerns regarding your invoice.   IF you received labwork today, you will receive an invoice from [place]. Please contact [facility] at [phone] with questions or concerns regarding your invoice.   Our billing staff will not be able to assist you with questions regarding bills from these companies.  You will be contacted with the lab results as soon as they are available. The fastest way to get your results is to activate your My Chart account. Instructions are located on the last page of this paperwork. If you have not heard from [place] regarding the results in 2 weeks, please contact this office.       I personally performed the services described in this documentation, which was scribed in my presence. The recorded information has been reviewed and considered for accuracy and completeness, addended by me as needed, and agree with information above.  Signed,   Meredith Staggers, MD Primary Care at [facility].  05/07/17 12:28 PM

## 2017-05-06 NOTE — Patient Instructions (Addendum)
Your ear blockage may be due to fluid behind the ear or eustachian tube dysfunction. This can sometime occur after air travel. Testing in the office also appears consistent with conductive hearing loss.   You can try flonase nasal spray, low dose decongestant such as sudafed.  If not improving in next week or persistent hearing loss, I can refer you to ENT. Let me know.    Eustachian Tube Dysfunction The eustachian tube connects the middle ear to the back of the nose. It regulates air pressure in the middle ear by allowing air to move between the ear and nose. It also helps to drain fluid from the middle ear space. When the eustachian tube does not function properly, air pressure, fluid, or both can build up in the middle ear. Eustachian tube dysfunction can affect one or both ears. What are the causes? This condition happens when the eustachian tube becomes blocked or cannot open normally. This may result from:  Ear infections.  Colds and other upper respiratory infections.  Allergies.  Irritation, such as from cigarette smoke or acid from the stomach coming up into the esophagus (gastroesophageal reflux).  Sudden changes in air pressure, such as from descending in an airplane.  Abnormal growths in the nose or throat, such as nasal polyps, tumors, or enlarged tissue at the back of the throat (adenoids).  What increases the risk? This condition may be more likely to develop in people who smoke and people who are overweight. Eustachian tube dysfunction may also be more likely to develop in children, especially children who have:  Certain birth defects of the mouth, such as cleft palate.  Large tonsils and adenoids.  What are the signs or symptoms? Symptoms of this condition may include:  A feeling of fullness in the ear.  Ear pain.  Clicking or popping noises in the ear.  Ringing in the ear.  Hearing loss.  Loss of balance.  Symptoms may get worse when the air pressure  around you changes, such as when you travel to an area of high elevation or fly on an airplane. How is this diagnosed? This condition may be diagnosed based on:  Your symptoms.  A physical exam of your ear, nose, and throat.  Tests, such as those that measure: ? The movement of your eardrum (tympanogram). ? Your hearing (audiometry).  How is this treated? Treatment depends on the cause and severity of your condition. If your symptoms are mild, you may be able to relieve your symptoms by moving air into ("popping") your ears. If you have symptoms of fluid in your ears, treatment may include:  Decongestants.  Antihistamines.  Nasal sprays or ear drops that contain medicines that reduce swelling (steroids).  In some cases, you may need to have a procedure to drain the fluid in your eardrum (myringotomy). In this procedure, a small tube is placed in the eardrum to:  Drain the fluid.  Restore the air in the middle ear space.  Follow these instructions at home:  Take over-the-counter and prescription medicines only as told by your health care provider.  Use techniques to help pop your ears as recommended by your health care provider. These may include: ? Chewing gum. ? Yawning. ? Frequent, forceful swallowing. ? Closing your mouth, holding your nose closed, and gently blowing as if you are trying to blow air out of your nose.  Do not do any of the following until your health care provider approves: ? Travel to high altitudes. ? Fly  in airplanes. ? Work in a Estate agentpressurized cabin or room. ? Scuba dive.  Keep your ears dry. Dry your ears completely after showering or bathing.  Do not smoke.  Keep all follow-up visits as told by your health care provider. This is important. Contact a health care provider if:  Your symptoms do not go away after treatment.  Your symptoms come back after treatment.  You are unable to pop your ears.  You have: ? A fever. ? Pain in your  ear. ? Pain in your head or neck. ? Fluid draining from your ear.  Your hearing suddenly changes.  You become very dizzy.  You lose your balance. This information is not intended to replace advice given to you by your health care provider. Make sure you discuss any questions you have with your health care provider. Document Released: 10/11/2015 Document Revised: 02/20/2016 Document Reviewed: 10/03/2014 Elsevier Interactive Patient Education  2018 [employer]    IF you received an x-ray today, you will receive an invoice from [facility]. Please contact [facility] at [phone] with questions or concerns regarding your invoice.   IF you received labwork today, you will receive an invoice from [facility]. Please contact [facility] at [phone] with questions or concerns regarding your invoice.   Our billing staff will not be able to assist you with questions regarding bills from these companies.  You will be contacted with the lab results as soon as they are available. The fastest way to get your results is to activate your My Chart account. Instructions are located on the last page of this paperwork. If you have not heard from us regarding the results in 2 weeks, please contact this office.

## 2018-01-03 DIAGNOSIS — R1031 Right lower quadrant pain: Secondary | ICD-10-CM | POA: Diagnosis not present

## 2018-01-03 DIAGNOSIS — Z8719 Personal history of other diseases of the digestive system: Secondary | ICD-10-CM | POA: Diagnosis not present

## 2018-01-03 DIAGNOSIS — M5416 Radiculopathy, lumbar region: Secondary | ICD-10-CM | POA: Diagnosis not present

## 2018-01-04 ENCOUNTER — Other Ambulatory Visit: Payer: Self-pay | Admitting: Chiropractic Medicine

## 2018-01-04 ENCOUNTER — Ambulatory Visit
Admission: RE | Admit: 2018-01-04 | Discharge: 2018-01-04 | Disposition: A | Discharge status: Home / Self Care | Payer: BLUE CROSS/BLUE SHIELD | Source: Ambulatory Visit | Attending: Chiropractic Medicine | Admitting: Chiropractic Medicine

## 2018-01-04 DIAGNOSIS — Z8719 Personal history of other diseases of the digestive system: Secondary | ICD-10-CM

## 2018-01-04 DIAGNOSIS — K760 Fatty (change of) liver, not elsewhere classified: Secondary | ICD-10-CM | POA: Diagnosis not present

## 2018-05-17 ENCOUNTER — Encounter: Payer: BLUE CROSS/BLUE SHIELD | Admitting: Family Medicine

## 2018-05-19 ENCOUNTER — Encounter: Payer: BLUE CROSS/BLUE SHIELD | Admitting: Family Medicine

## 2018-06-07 ENCOUNTER — Other Ambulatory Visit: Payer: Self-pay

## 2018-06-07 ENCOUNTER — Ambulatory Visit (INDEPENDENT_AMBULATORY_CARE_PROVIDER_SITE_OTHER): Payer: BLUE CROSS/BLUE SHIELD | Admitting: Family Medicine

## 2018-06-07 ENCOUNTER — Encounter: Payer: Self-pay | Admitting: Family Medicine

## 2018-06-07 VITALS — BP 125/79 | HR 89 | Temp 98.8°F | Ht 69.0 in | Wt 192.6 lb

## 2018-06-07 DIAGNOSIS — Z125 Encounter for screening for malignant neoplasm of prostate: Secondary | ICD-10-CM | POA: Diagnosis not present

## 2018-06-07 DIAGNOSIS — Z131 Encounter for screening for diabetes mellitus: Secondary | ICD-10-CM | POA: Diagnosis not present

## 2018-06-07 DIAGNOSIS — Z114 Encounter for screening for human immunodeficiency virus [HIV]: Secondary | ICD-10-CM | POA: Diagnosis not present

## 2018-06-07 DIAGNOSIS — Z1159 Encounter for screening for other viral diseases: Secondary | ICD-10-CM

## 2018-06-07 DIAGNOSIS — Z13220 Encounter for screening for lipoid disorders: Secondary | ICD-10-CM | POA: Diagnosis not present

## 2018-06-07 DIAGNOSIS — Z23 Encounter for immunization: Secondary | ICD-10-CM | POA: Diagnosis not present

## 2018-06-07 DIAGNOSIS — Z0000 Encounter for general adult medical examination without abnormal findings: Secondary | ICD-10-CM

## 2018-06-07 MED ORDER — ZOSTER VAC RECOMB ADJUVANTED 50 MCG/0.5ML IM SUSR: 0.5000 mL | Freq: Once | INTRAMUSCULAR | 1 refills | Status: AC

## 2018-06-07 NOTE — Progress Notes (Signed)
Subjective:  By signing my name below, I, Leonard Holland, attest that this documentation has been prepared under the direction and in the presence of Shade Flood, MD Electronically Signed: Charline Bills, ED Scribe 06/07/2018 at 10:45 AM.   Patient ID: Leonard Holland, male    DOB: 1955-12-26, 62 y.o.   MRN: [id number]  Chief Complaint  Patient presents with  . Annual Exam    CPE   HPI  Leonard Holland is a 62 y.o. male who presents to Primary Care at [facility] for an annual exam. Pt is fasting at this visit.  Pt has noticed less firmness of erection over the past yr. He denies difficulty obtaining an erection, decreased libido, depression. Pt does not want to start medication at this time.  CA Screening Colonoscopy: 03/09/13 by Dr. Loreta Ave, rpt 10 yrs Prostate CA Screening: pt agrees to PSA and DRE today Lab Results  Component Value Date   PSA 2.38 02/04/2013   Immunizations  There is no immunization history on file for this patient. Shingrix: sent to pharmacy Flu: today Tdap: ~10 yrs ago Hep C and HIV screening: due today  Depression Screening Depression screen [facility] 2/9 06/07/2018 06/07/2018 05/06/2017 06/30/2016  Decreased Interest 0 0 0 0  Down, Depressed, Hopeless 0 0 0 0  PHQ - 2 Score 0 0 0 0    Visual Acuity Screening   Right eye Left eye Both eyes  Without correction:     With correction: 20/20 20/30-1 20/20-1   Vision: annually; wears glasses Dentist: every 6 months Exercise: pt does a lot of yard work, no other regular exercise  There are no active problems to display for this patient.  Past Medical History:  Diagnosis Date  . Anxiety   . Depression   . Depression   . Heart murmur   . Migraine   . Migraines   . Mitral valve prolapse   . [place] spotted fever    Past Surgical History:  Procedure Laterality Date  . HERNIA REPAIR    . VASECTOMY    . WRIST SURGERY Right    tendon chief release   Allergies  Allergen Reactions  . Penicillins Other  (See Comments)    convulsions   Prior to Admission medications   Not on File   Social History   Socioeconomic History  . Marital status: Married    Spouse name: Not on file  . Number of children: Not on file  . Years of education: Not on file  . Highest education level: Not on file  Occupational History  . Not on file  Social Needs  . Financial resource strain: Not on file  . Food insecurity:    Worry: Not on file    Inability: Not on file  . Transportation needs:    Medical: Not on file    Non-medical: Not on file  Tobacco Use  . Smoking status: Never Smoker  . Smokeless tobacco: Never Used  Substance and Sexual Activity  . Alcohol use: Yes    Alcohol/week: 1.0 standard drinks    Types: 1 Cans of beer per week  . Drug use: No  . Sexual activity: Yes    Birth control/protection: None  Lifestyle  . Physical activity:    Days per week: Not on file    Minutes per session: Not on file  . Stress: Not on file  Relationships  . Social connections:    Talks on phone: Not on file  Gets together: Not on file    Attends religious service: Not on file    Active member of club or organization: Not on file    Attends meetings of clubs or organizations: Not on file    Relationship status: Not on file  . Intimate partner violence:    Fear of current or ex partner: Not on file    Emotionally abused: Not on file    Physically abused: Not on file    Forced sexual activity: Not on file  Other Topics Concern  . Not on file  Social History Narrative  . Not on file   Review of Systems  Psychiatric/Behavioral: Negative for dysphoric mood.  All other systems reviewed and are negative. 13 point ROS, neg    Objective:   Physical Exam  Constitutional: He is oriented to person, place, and time. He appears well-developed and well-nourished.  HENT:  Head: Normocephalic and atraumatic.  Right Ear: External ear normal.  Left Ear: External ear normal.  Mouth/Throat: Oropharynx is  clear and moist.  Eyes: Pupils are equal, round, and reactive to light. Conjunctivae and EOM are normal.  Neck: Normal range of motion. Neck supple. No thyromegaly present.  Cardiovascular: Normal rate, regular rhythm, normal heart sounds and intact distal pulses.  Pulmonary/Chest: Effort normal and breath sounds normal. No respiratory distress. He has no wheezes.  Abdominal: Soft. He exhibits no distension. There is no tenderness. Hernia confirmed negative in the right inguinal area and confirmed negative in the left inguinal area.  Genitourinary:  Genitourinary Comments: Firm prostate, borderline enlarged, but no focal nodularity. Nontender.   Musculoskeletal: Normal range of motion. He exhibits no edema or tenderness.  Lymphadenopathy:    He has no cervical adenopathy.  Neurological: He is alert and oriented to person, place, and time. He has normal reflexes.  Skin: Skin is warm and dry.  Psychiatric: He has a normal mood and affect. His behavior is normal.  Vitals reviewed.    Vitals:   06/07/18 1004  BP: 125/79  Pulse: 89  Temp: 98.8 F (37.1 C)  TempSrc: Oral  SpO2: 97%  Weight: 192 lb 9.6 oz (87.4 kg)  Height: 5\' 9"  (1.753 m)      Assessment & Plan:   Leonard Holland is a 62 y.o. male Annual physical exam  - -anticipatory guidance as below in AVS, screening labs above. Health maintenance items as above in HPI discussed/recommended as applicable.   -Decrease erectile tumescence, offered testosterone testing, declined at this time.  Screening for prostate cancer - Plan: PSA  - We discussed pros and cons of prostate cancer screening, and after this discussion, he chose to have screening done. PSA obtained, DRE as above.   Need for Tdap vaccination - Plan: Tdap vaccine greater than or equal to 7yo IM  Needs flu shot - Plan: Flu Vaccine QUAD 36+ mos IM  Need for shingles vaccine - Plan: Zoster Vaccine Adjuvanted [facility]) injection sent to pharmacy  Screening for HIV  (human immunodeficiency virus) - Plan: HIV antibody  Need for hepatitis C screening test - Plan: Hepatitis C antibody  Screening for hyperlipidemia - Plan: Lipid panel  Screening for diabetes mellitus - Plan: Comprehensive metabolic panel   Meds ordered this encounter  Medications  . Zoster Vaccine Adjuvanted [facility]) injection    Sig: Inject 0.5 mLs into the muscle once for 1 dose. Repeat in 2-6 months.    Dispense:  0.5 mL    Refill:  1   Patient Instructions  Tetanus and flu vaccines given, shingles vaccine at your pharmacy.   Let me know if you would like to check testosterone levels.   Prostate firm, but will check PSA. If that is elevated, I would recommend meeting with urologist.   I will check other bloodwork as well.  Thank you for coming in today.   Keeping you healthy  Get these tests  Blood pressure- Have your blood pressure checked once a year by your healthcare provider.  Normal blood pressure is 120/80  Weight- Have your body mass index (BMI) calculated to screen for obesity.  BMI is a measure of body fat based on height and weight. You can also calculate your own BMI at [email or web address].  Cholesterol- Have your cholesterol checked every year.  Diabetes- Have your blood sugar checked regularly if you have high blood pressure, high cholesterol, have a family history of diabetes or if you are overweight.  Screening for Colon Cancer- Colonoscopy starting at age 67.  Screening may begin sooner depending on your family history and other health conditions. Follow up colonoscopy as directed by your Gastroenterologist.  Screening for Prostate Cancer- Both blood work (PSA) and a rectal exam help screen for Prostate Cancer.  Screening begins at age 24 with African-American men and at age 80 with Caucasian men.  Screening may begin sooner depending on your family history.  Take these medicines  Aspirin- One aspirin daily can help prevent Heart disease and  Stroke.  Flu shot- Every fall.  Tetanus- Every 10 years.  Zostavax- Once after the age of 30 to prevent Shingles.  Pneumonia shot- Once after the age of 15; if you are younger than 25, ask your healthcare provider if you need a Pneumonia shot.  Take these steps  Don't smoke- If you do smoke, talk to your doctor about quitting.  For tips on how to quit, go to www.smokefree.gov or call 1-800-QUIT-NOW.  Be physically active- Exercise 5 days a week for at least 30 minutes.  If you are not already physically active start slow and gradually work up to 30 minutes of moderate physical activity.  Examples of moderate activity include walking briskly, mowing the yard, dancing, swimming, bicycling, etc.  Eat a healthy diet- Eat a variety of healthy food such as fruits, vegetables, low fat milk, low fat cheese, yogurt, lean meant, poultry, fish, beans, tofu, etc. For more information go to www.thenutritionsource.org  Drink alcohol in moderation- Limit alcohol intake to less than two drinks a day. Never drink and drive.  Dentist- Brush and floss twice daily; visit your dentist twice a year.  Depression- Your emotional health is as important as your physical health. If you're feeling down, or losing interest in things you would normally enjoy please talk to your healthcare provider.  Eye exam- Visit your eye doctor every year.  Safe sex- If you may be exposed to a sexually transmitted infection, use a condom.  Seat belts- Seat belts can save your life; always wear one.  Smoke/Carbon Monoxide detectors- These detectors need to be installed on the appropriate level of your home.  Replace batteries at least once a year.  Skin cancer- When out in the sun, cover up and use sunscreen 15 SPF or higher.  Violence- If anyone is threatening you, please tell your healthcare provider. Living Will/ Health care power of attorney- Speak with your healthcare provider and family.    If you have lab work done  today you will be contacted with your lab results within the  next 2 weeks.  If you have not heard from [place] then please contact us. The fastest way to get your results is to register for My Chart.   IF you received an x-ray today, you will receive an invoice from [facility]. Please contact [facility] at [phone] with questions or concerns regarding your invoice.   IF you received labwork today, you will receive an invoice from [place]. Please contact [facility] at [phone] with questions or concerns regarding your invoice.   Our billing staff will not be able to assist you with questions regarding bills from these companies.  You will be contacted with the lab results as soon as they are available. The fastest way to get your results is to activate your My Chart account. Instructions are located on the last page of this paperwork. If you have not heard from [place] regarding the results in 2 weeks, please contact this office.       I personally performed the services described in this documentation, which was scribed in my presence. The recorded information has been reviewed and considered for accuracy and completeness, addended by me as needed, and agree with information above.  Signed,   Meredith Staggers, MD Primary Care at [facility].  06/07/18 9:23 PM

## 2018-06-07 NOTE — Patient Instructions (Addendum)
Tetanus and flu vaccines given, shingles vaccine at your pharmacy.   Let me know if you would like to check testosterone levels.   Prostate firm, but will check PSA. If that is elevated, I would recommend meeting with urologist.   I will check other bloodwork as well.  Thank you for coming in today.   Keeping you healthy  Get these tests  Blood pressure- Have your blood pressure checked once a year by your healthcare provider.  Normal blood pressure is 120/80  Weight- Have your body mass index (BMI) calculated to screen for obesity.  BMI is a measure of body fat based on height and weight. You can also calculate your own BMI at [email or web address].  Cholesterol- Have your cholesterol checked every year.  Diabetes- Have your blood sugar checked regularly if you have high blood pressure, high cholesterol, have a family history of diabetes or if you are overweight.  Screening for Colon Cancer- Colonoscopy starting at age 40.  Screening may begin sooner depending on your family history and other health conditions. Follow up colonoscopy as directed by your Gastroenterologist.  Screening for Prostate Cancer- Both blood work (PSA) and a rectal exam help screen for Prostate Cancer.  Screening begins at age 23 with African-American men and at age 15 with Caucasian men.  Screening may begin sooner depending on your family history.  Take these medicines  Aspirin- One aspirin daily can help prevent Heart disease and Stroke.  Flu shot- Every fall.  Tetanus- Every 10 years.  Zostavax- Once after the age of 84 to prevent Shingles.  Pneumonia shot- Once after the age of 55; if you are younger than 3, ask your healthcare provider if you need a Pneumonia shot.  Take these steps  Don't smoke- If you do smoke, talk to your doctor about quitting.  For tips on how to quit, go to www.smokefree.gov or call 1-800-QUIT-NOW.  Be physically active- Exercise 5 days a week for at least 30 minutes.   If you are not already physically active start slow and gradually work up to 30 minutes of moderate physical activity.  Examples of moderate activity include walking briskly, mowing the yard, dancing, swimming, bicycling, etc.  Eat a healthy diet- Eat a variety of healthy food such as fruits, vegetables, low fat milk, low fat cheese, yogurt, lean meant, poultry, fish, beans, tofu, etc. For more information go to www.thenutritionsource.org  Drink alcohol in moderation- Limit alcohol intake to less than two drinks a day. Never drink and drive.  Dentist- Brush and floss twice daily; visit your dentist twice a year.  Depression- Your emotional health is as important as your physical health. If you're feeling down, or losing interest in things you would normally enjoy please talk to your healthcare provider.  Eye exam- Visit your eye doctor every year.  Safe sex- If you may be exposed to a sexually transmitted infection, use a condom.  Seat belts- Seat belts can save your life; always wear one.  Smoke/Carbon Monoxide detectors- These detectors need to be installed on the appropriate level of your home.  Replace batteries at least once a year.  Skin cancer- When out in the sun, cover up and use sunscreen 15 SPF or higher.  Violence- If anyone is threatening you, please tell your healthcare provider. Living Will/ Health care power of attorney- Speak with your healthcare provider and family.    If you have lab work done today you will be contacted with your lab results within the next  2 weeks.  If you have not heard from [place] then please contact us. The fastest way to get your results is to register for My Chart.   IF you received an x-ray today, you will receive an invoice from [facility]. Please contact [facility] at [phone] with questions or concerns regarding your invoice.   IF you received labwork today, you will receive an invoice from [place]. Please contact [facility]  at [phone] with questions or concerns regarding your invoice.   Our billing staff will not be able to assist you with questions regarding bills from these companies.  You will be contacted with the lab results as soon as they are available. The fastest way to get your results is to activate your My Chart account. Instructions are located on the last page of this paperwork. If you have not heard from [place] regarding the results in 2 weeks, please contact this office.

## 2018-06-08 LAB — COMPREHENSIVE METABOLIC PANEL
ALBUMIN: 4.5 g/dL (ref 3.6–4.8)
ALK PHOS: 62 IU/L (ref 39–117)
ALT: 29 IU/L (ref 0–44)
AST: 19 IU/L (ref 0–40)
Albumin/Globulin Ratio: 2 (ref 1.2–2.2)
BILIRUBIN TOTAL: 0.5 mg/dL (ref 0.0–1.2)
BUN / CREAT RATIO: 15 (ref 10–24)
BUN: 17 mg/dL (ref 8–27)
CALCIUM: 9.7 mg/dL (ref 8.6–10.2)
CHLORIDE: 108 mmol/L — AB (ref 96–106)
CO2: 19 mmol/L — AB (ref 20–29)
Creatinine, Ser: 1.15 mg/dL (ref 0.76–1.27)
GFR calc Af Amer: 78 mL/min/{1.73_m2} (ref >59)
GFR calc non Af Amer: 67 mL/min/{1.73_m2} (ref >59)
Globulin, Total: 2.3 g/dL (ref 1.5–4.5)
Glucose: 98 mg/dL (ref 65–99)
Potassium: 4.6 mmol/L (ref 3.5–5.2)
Sodium: 143 mmol/L (ref 134–144)
Total Protein: 6.8 g/dL (ref 6.0–8.5)

## 2018-06-08 LAB — HEPATITIS C ANTIBODY: Hep C Virus Ab: <0.1 s/co ratio (ref 0.0–0.9)

## 2018-06-08 LAB — PSA: Prostate Specific Ag, Serum: 1.3 ng/mL (ref 0.0–4.0)

## 2018-06-08 LAB — LIPID PANEL
CHOL/HDL RATIO: 2.5 ratio (ref 0.0–5.0)
Cholesterol, Total: 122 mg/dL (ref 100–199)
HDL: 49 mg/dL (ref >39)
LDL Calculated: 58 mg/dL (ref 0–99)
Triglycerides: 75 mg/dL (ref 0–149)
VLDL Cholesterol Cal: 15 mg/dL (ref 5–40)

## 2018-06-08 LAB — HIV ANTIBODY (ROUTINE TESTING W REFLEX): HIV Screen 4th Generation wRfx: NONREACTIVE

## 2018-10-07 IMAGING — CT CT ABDOMEN W/O CM
1 of 2 series · 13 of 32 positions shown, 19 images · non-contrast
Comparison: None.

CLINICAL DATA: 61-year-old with right lateral side pain. History of
abdominal hernia.

EXAM:
CT ABDOMEN WITHOUT CONTRAST
TECHNIQUE: Multidetector CT imaging of the abdomen was performed following the
standard protocol without IV contrast.

[Series 2: abd w/(date) · axial · 0.70mm/px · z∈[-534,-289]mm · 13 of 57 slices shown, 19 images]
[im 4/57  soft-tissue]
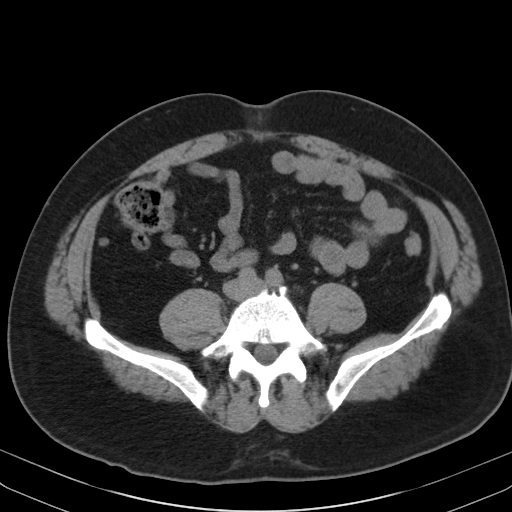
[im 4/57  bone]
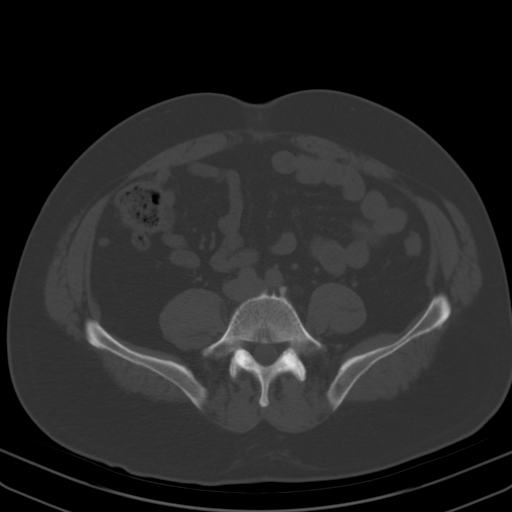
[im 8/57  soft-tissue]
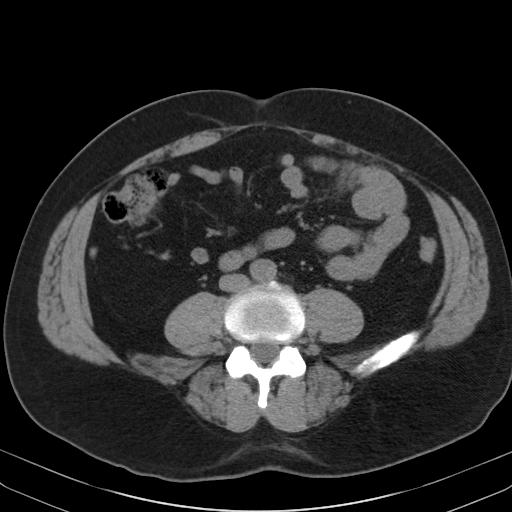
[im 12/57  soft-tissue]
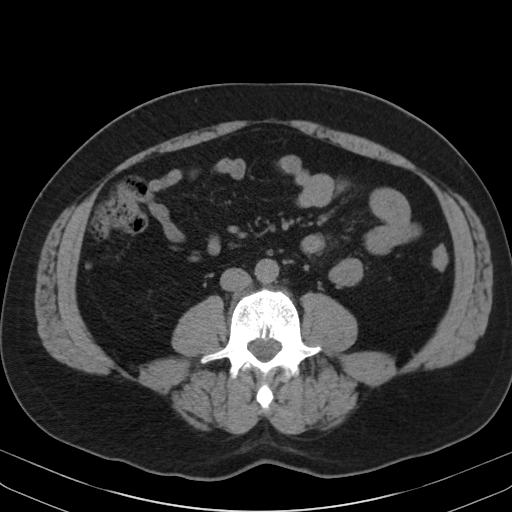
[im 15/57  soft-tissue]
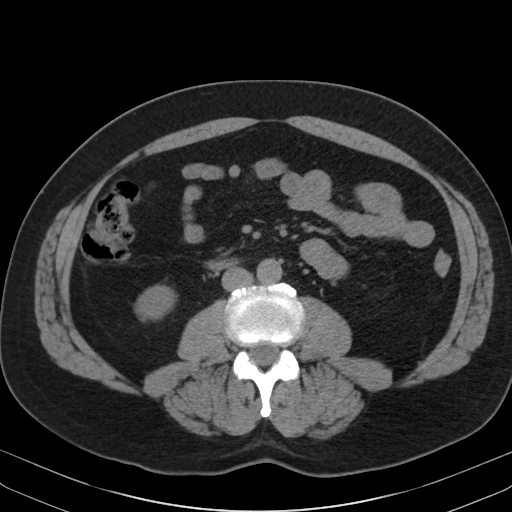
[im 19/57  soft-tissue]
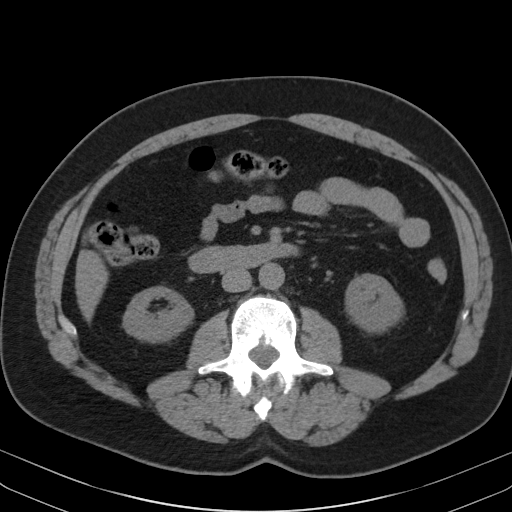
[im 23/57  soft-tissue]
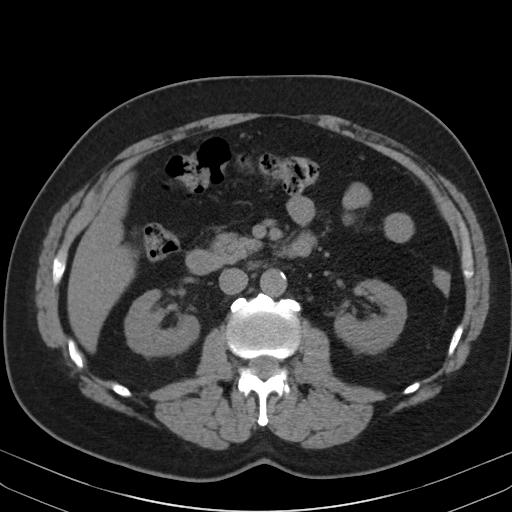
[im 30/57  soft-tissue]
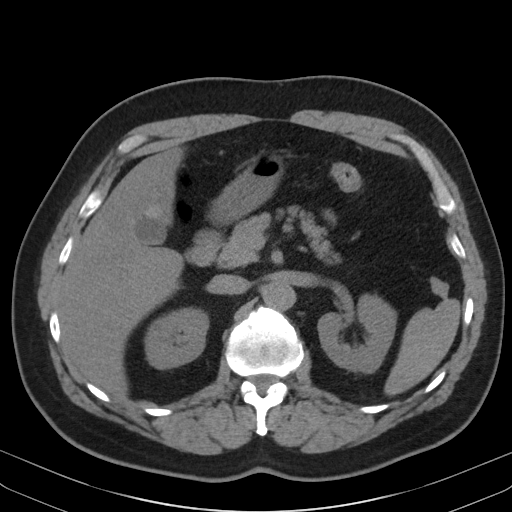
[im 34/57  soft-tissue]
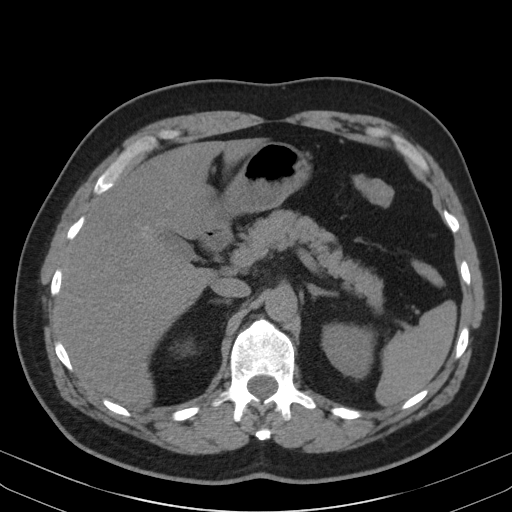
[im 38/57  soft-tissue]
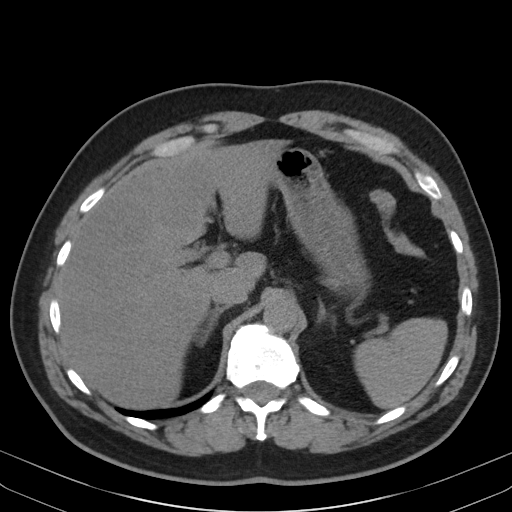
[im 38/57  bone]
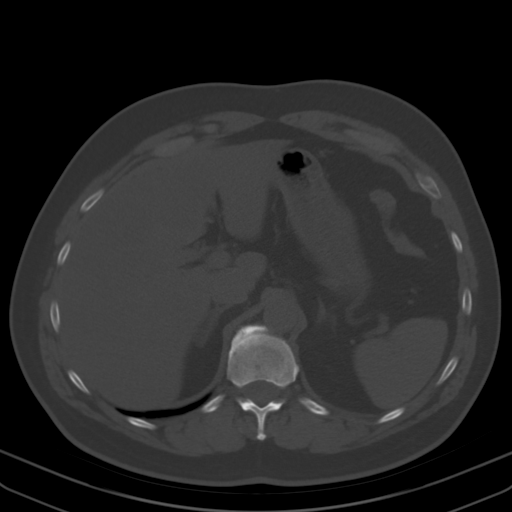
[im 42/57  soft-tissue]
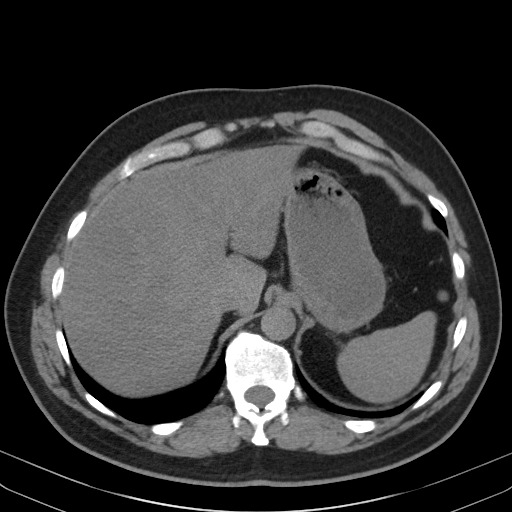
[im 42/57  lung]
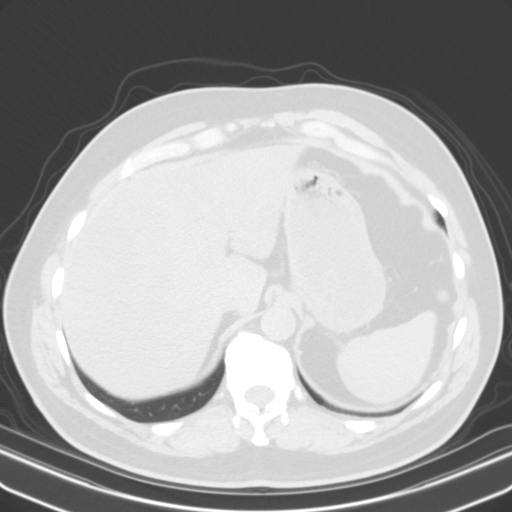
[im 45/57  soft-tissue]
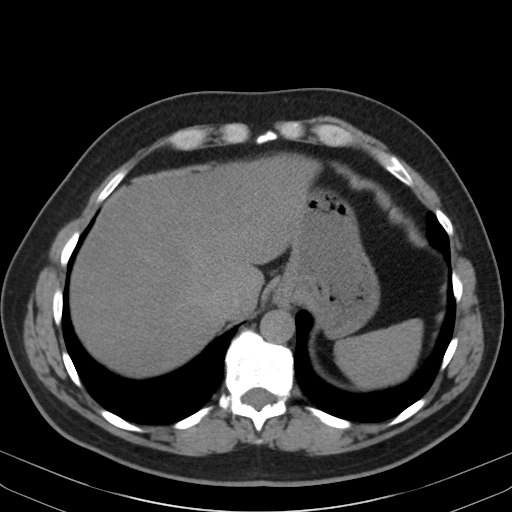
[im 45/57  lung]
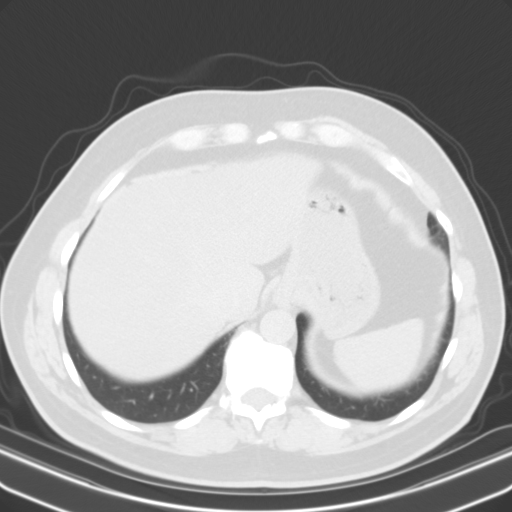
[im 49/57  soft-tissue]
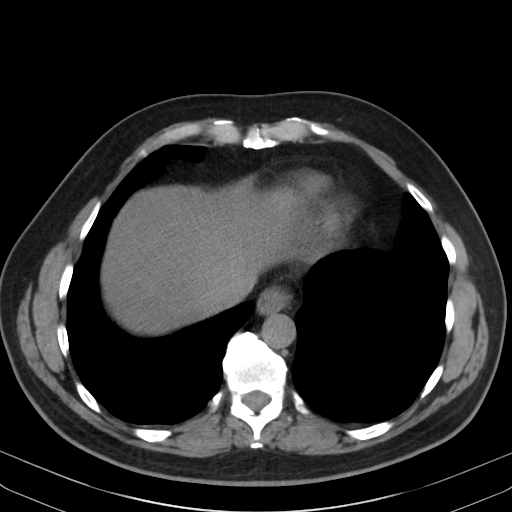
[im 49/57  lung]
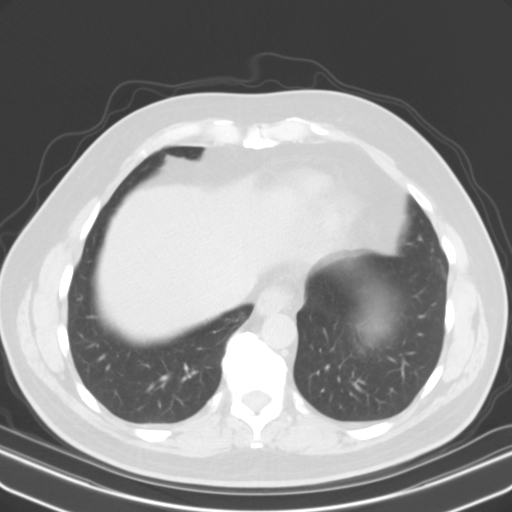
[im 53/57  soft-tissue]
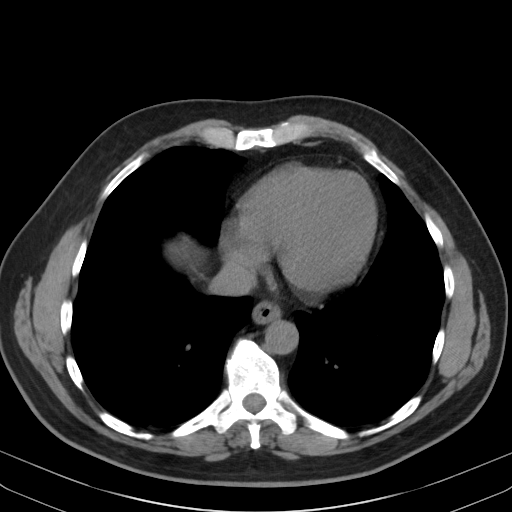
[im 53/57  lung]
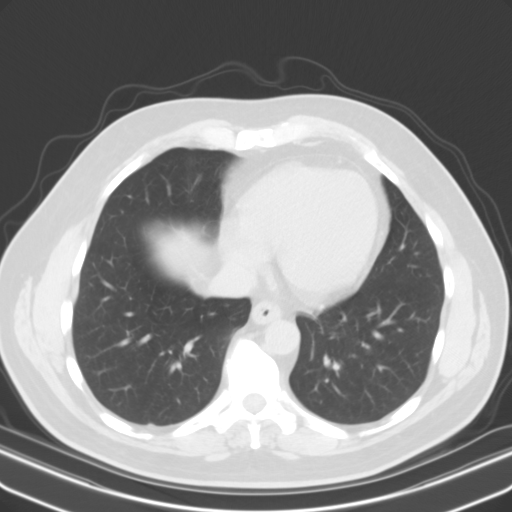

[13 of 32 positions shown; findings below may reference images not displayed]

FINDINGS: Lower chest: Lung bases are clear.

Hepatobiliary: Patchy areas of low-density throughout the liver.
Findings are compatible with geographic steatosis. Focal fat sparing
adjacent to the gallbladder. Normal appearance of the gallbladder
without distension.

Pancreas: Unremarkable. No pancreatic ductal dilatation or
surrounding inflammatory changes.

Spleen: Normal in size without focal abnormality.

Adrenals/Urinary Tract: Normal adrenals. Normal appearance of both
kidneys without stones or hydronephrosis.

Stomach/Bowel: Small hiatal hernia. Appendix is partially visualized
without inflammatory changes. Normal appearance of the visualized
small and large bowel. No focal bowel inflammation.

Vascular/Lymphatic: Minimal wall calcifications in the abdominal
aorta without aneurysm. No lymph node enlargement in the abdomen.

Other: Small umbilical hernia containing fat.

Musculoskeletal: Bridging osteophytes and endplate changes in the
spine.
IMPRESSION: No acute abnormality in the abdomen. Specifically, no evidence for
kidney stones or hydronephrosis.

Hepatic steatosis.

## 2019-06-13 ENCOUNTER — Encounter: Payer: BLUE CROSS/BLUE SHIELD | Admitting: Family Medicine

## 2019-06-14 ENCOUNTER — Encounter: Payer: BC Managed Care – PPO | Admitting: Family Medicine

## 2019-07-14 ENCOUNTER — Encounter: Payer: BC Managed Care – PPO | Admitting: Family Medicine

## 2019-08-14 ENCOUNTER — Ambulatory Visit (INDEPENDENT_AMBULATORY_CARE_PROVIDER_SITE_OTHER): Payer: BC Managed Care – PPO | Admitting: Family Medicine

## 2019-08-14 ENCOUNTER — Other Ambulatory Visit: Payer: Self-pay

## 2019-08-14 ENCOUNTER — Encounter: Payer: Self-pay | Admitting: Family Medicine

## 2019-08-14 VITALS — BP 140/78 | HR 84 | Temp 99.1°F | Wt 197.3 lb

## 2019-08-14 DIAGNOSIS — Z0000 Encounter for general adult medical examination without abnormal findings: Secondary | ICD-10-CM | POA: Diagnosis not present

## 2019-08-14 DIAGNOSIS — Z1329 Encounter for screening for other suspected endocrine disorder: Secondary | ICD-10-CM

## 2019-08-14 DIAGNOSIS — Z23 Encounter for immunization: Secondary | ICD-10-CM

## 2019-08-14 DIAGNOSIS — Z130 Encounter for screening for diseases of the blood and blood-forming organs and certain disorders involving the immune mechanism: Secondary | ICD-10-CM | POA: Diagnosis not present

## 2019-08-14 DIAGNOSIS — Z131 Encounter for screening for diabetes mellitus: Secondary | ICD-10-CM | POA: Diagnosis not present

## 2019-08-14 DIAGNOSIS — Z13220 Encounter for screening for lipoid disorders: Secondary | ICD-10-CM | POA: Diagnosis not present

## 2019-08-14 NOTE — Patient Instructions (Addendum)
Thanks for coming in today.  Glad to hear the new job has been going well.  Occasional Tylenol if needed for aches and pains, turmeric, glucosamine are okay for arthritis type pains.  If one area remains sore, please follow-up and we can discuss that further.  Take care.  Keeping you healthy  Get these tests  Blood pressure- Have your blood pressure checked once a year by your healthcare provider.  Normal blood pressure is 120/80  Weight- Have your body mass index (BMI) calculated to screen for obesity.  BMI is a measure of body fat based on height and weight. You can also calculate your own BMI at [email or web address].  Cholesterol- Have your cholesterol checked every year.  Diabetes- Have your blood sugar checked regularly if you have high blood pressure, high cholesterol, have a family history of diabetes or if you are overweight.  Screening for Colon Cancer- Colonoscopy starting at age 55.  Screening may begin sooner depending on your family history and other health conditions. Follow up colonoscopy as directed by your Gastroenterologist.  Screening for Prostate Cancer- Both blood work (PSA) and a rectal exam help screen for Prostate Cancer.  Screening begins at age 61 with African-American men and at age 41 with Caucasian men.  Screening may begin sooner depending on your family history.  Take these medicines  Aspirin- One aspirin daily can help prevent Heart disease and Stroke.  Flu shot- Every fall.  Tetanus- Every 10 years.  Zostavax- Once after the age of 68 to prevent Shingles.  Pneumonia shot- Once after the age of 54; if you are younger than 57, ask your healthcare provider if you need a Pneumonia shot.  Take these steps  Don't smoke- If you do smoke, talk to your doctor about quitting.  For tips on how to quit, go to www.smokefree.gov or call 1-800-QUIT-NOW.  Be physically active- Exercise 5 days a week for at least 30 minutes.  If you are not already physically  active start slow and gradually work up to 30 minutes of moderate physical activity.  Examples of moderate activity include walking briskly, mowing the yard, dancing, swimming, bicycling, etc.  Eat a healthy diet- Eat a variety of healthy food such as fruits, vegetables, low fat milk, low fat cheese, yogurt, lean meant, poultry, fish, beans, tofu, etc. For more information go to www.thenutritionsource.org  Drink alcohol in moderation- Limit alcohol intake to less than two drinks a day. Never drink and drive.  Dentist- Brush and floss twice daily; visit your dentist twice a year.  Depression- Your emotional health is as important as your physical health. If you're feeling down, or losing interest in things you would normally enjoy please talk to your healthcare provider.  Eye exam- Visit your eye doctor every year.  Safe sex- If you may be exposed to a sexually transmitted infection, use a condom.  Seat belts- Seat belts can save your life; always wear one.  Smoke/Carbon Monoxide detectors- These detectors need to be installed on the appropriate level of your home.  Replace batteries at least once a year.  Skin cancer- When out in the sun, cover up and use sunscreen 15 SPF or higher.  Violence- If anyone is threatening you, please tell your healthcare provider.  Living Will/ Health care power of attorney- Speak with your healthcare provider and family.  If you have lab work done today you will be contacted with your lab results within the next 2 weeks.  If you have not heard from  [place] then please contact us. The fastest way to get your results is to register for My Chart.   IF you received an x-ray today, you will receive an invoice from [facility]. Please contact [facility] at [phone] with questions or concerns regarding your invoice.   IF you received labwork today, you will receive an invoice from [place]. Please contact [facility] at [phone] with questions  or concerns regarding your invoice.   Our billing staff will not be able to assist you with questions regarding bills from these companies.  You will be contacted with the lab results as soon as they are available. The fastest way to get your results is to activate your My Chart account. Instructions are located on the last page of this paperwork. If you have not heard from [place] regarding the results in 2 weeks, please contact this office.

## 2019-08-14 NOTE — Progress Notes (Addendum)
Subjective:  Patient ID: Leonard Holland, male    DOB: [birth year]  Age: 63 y.o. MRN: [id number]  CC:  Chief Complaint  Patient presents with  . Annual Exam    here for annual physical with no other issues at this time   HPI Leonard Holland presents for   Annual physical exam  Voluntary retirement from [employer] since last visit, now Adult nurse at [employer]. lost weight with this work. 2/10 hours lifting at work 4 days per week.  Doing well. Occasional soreness. 7 weeks at this job.   Cancer screening Colonoscopy March 09, 2013, Dr. Loreta Ave, repeat 10 years Prostate, PSA 1.3 last year. No FH  - deferred testing for this year, plans next year.   Immunization History  Administered Date(s) Administered  . Influenza,inj,Quad PF,6+ Mos 06/07/2018, 08/14/2019  . Tdap 06/07/2018  Shingles vaccination: Shingrix x2 last year.   Depression screen [facility] 2/9 08/14/2019 06/07/2018 06/07/2018 05/06/2017 06/30/2016  Decreased Interest 0 0 0 0 0  Down, Depressed, Hopeless 0 0 0 0 0  PHQ - 2 Score 0 0 0 0 0    Hearing Screening   125Hz  250Hz  500Hz  1000Hz  2000Hz  3000Hz  4000Hz  6000Hz  8000Hz   Right ear:           Left ear:             Visual Acuity Screening   Right eye Left eye Both eyes  Without correction:     With correction: 20/20 20/25 20/20   optho 1 month ago  Dental: Nephew is dentist. Ongoing care. Recent crown.   Exercise: As above - increased activity at work, some yardwork.    History There are no active problems to display for this patient.  Past Medical History:  Diagnosis Date  . Anxiety   . Depression   . Depression   . Heart murmur   . Migraine   . Migraines   . Mitral valve prolapse   . [place] spotted fever    Past Surgical History:  Procedure Laterality Date  . HERNIA REPAIR    . VASECTOMY    . WRIST SURGERY Right    tendon chief release   Allergies  Allergen Reactions  . Penicillins Other (See Comments)    convulsions   Prior to Admission medications    Not on File   Social History   Socioeconomic History  . Marital status: Married    Spouse name: Not on file  . Number of children: Not on file  . Years of education: Not on file  . Highest education level: Not on file  Occupational History  . Not on file  Social Needs  . Financial resource strain: Not on file  . Food insecurity    Worry: Not on file    Inability: Not on file  . Transportation needs    Medical: Not on file    Non-medical: Not on file  Tobacco Use  . Smoking status: Never Smoker  . Smokeless tobacco: Never Used  Substance and Sexual Activity  . Alcohol use: Yes    Alcohol/week: 1.0 standard drinks    Types: 1 Cans of beer per week  . Drug use: No  . Sexual activity: Yes    Birth control/protection: None  Lifestyle  . Physical activity    Days per week: Not on file    Minutes per session: Not on file  . Stress: Not on file  Relationships  . Social on  phone: Not on file    Gets together: Not on file    Attends religious service: Not on file    Active member of club or organization: Not on file    Attends meetings of clubs or organizations: Not on file    Relationship status: Not on file  . Intimate partner violence    Fear of current or ex partner: Not on file    Emotionally abused: Not on file    Physically abused: Not on file    Forced sexual activity: Not on file  Other Topics Concern  . Not on file  Social History Narrative  . Not on file    Review of Systems 13 point review of systems per patient health survey noted.  Negative other than as indicated above or in HPI.    Objective:   Vitals:   08/14/19 1339  BP: 140/78  Pulse: 84  Temp: 99.1 F (37.3 C)  TempSrc: Oral  SpO2: 97%  Weight: 197 lb 5.1 oz (89.5 kg)   Physical Exam Vitals signs reviewed.  Constitutional:      Appearance: He is well-developed.  HENT:     Head: Normocephalic and atraumatic.     Right Ear: External ear normal.     Left Ear:  External ear normal.  Eyes:     Conjunctiva/sclera: Conjunctivae normal.     Pupils: Pupils are equal, round, and reactive to light.  Neck:     Musculoskeletal: Normal range of motion and neck supple.     Thyroid: No thyromegaly.  Cardiovascular:     Rate and Rhythm: Normal rate and regular rhythm.     Heart sounds: Normal heart sounds.  Pulmonary:     Effort: Pulmonary effort is normal. No respiratory distress.     Breath sounds: Normal breath sounds. No wheezing.  Abdominal:     General: There is no distension.     Palpations: Abdomen is soft.     Tenderness: There is no abdominal tenderness.  Musculoskeletal: Normal range of motion.        General: No tenderness.  Lymphadenopathy:     Cervical: No cervical adenopathy.  Skin:    General: Skin is warm and dry.  Neurological:     Mental Status: He is alert and oriented to person, place, and time.     Deep Tendon Reflexes: Reflexes are normal and symmetric.  Psychiatric:        Behavior: Behavior normal.      Assessment & Plan:  Leonard Holland is a 63 y.o. male . Annual physical exam  - -anticipatory guidance as below in AVS, screening labs above. Health maintenance items as above in HPI discussed/recommended as applicable.   -Episodic Tylenol as needed for minor aches and pains, glucosamine, turmeric as needed.  RTC precautions if specific area of pain or persistent.  Need for prophylactic vaccination and inoculation against influenza - Plan: Flu Vaccine QUAD 6+ mos PF IM (Fluarix Quad PF)  Screening, anemia, deficiency, iron - Plan: CBC  Screening for hyperlipidemia - Plan: Lipid panel  Screening for thyroid disorder - Plan: TSH  Screening for diabetes mellitus - Plan: Comprehensive metabolic panel   No orders of the defined types were placed in this encounter.  Patient Instructions    Thanks for coming in today.  Glad to hear the new job has been going well.  Occasional Tylenol if needed for aches and pains,  turmeric, glucosamine are okay for arthritis type pains.  If one  area remains sore, please follow-up and we can discuss that further.  Take care.  Keeping you healthy  Get these tests  Blood pressure- Have your blood pressure checked once a year by your healthcare provider.  Normal blood pressure is 120/80  Weight- Have your body mass index (BMI) calculated to screen for obesity.  BMI is a measure of body fat based on height and weight. You can also calculate your own BMI at [email or web address].  Cholesterol- Have your cholesterol checked every year.  Diabetes- Have your blood sugar checked regularly if you have high blood pressure, high cholesterol, have a family history of diabetes or if you are overweight.  Screening for Colon Cancer- Colonoscopy starting at age 75.  Screening may begin sooner depending on your family history and other health conditions. Follow up colonoscopy as directed by your Gastroenterologist.  Screening for Prostate Cancer- Both blood work (PSA) and a rectal exam help screen for Prostate Cancer.  Screening begins at age 20 with African-American men and at age 19 with Caucasian men.  Screening may begin sooner depending on your family history.  Take these medicines  Aspirin- One aspirin daily can help prevent Heart disease and Stroke.  Flu shot- Every fall.  Tetanus- Every 10 years.  Zostavax- Once after the age of 10 to prevent Shingles.  Pneumonia shot- Once after the age of 43; if you are younger than 66, ask your healthcare provider if you need a Pneumonia shot.  Take these steps  Don't smoke- If you do smoke, talk to your doctor about quitting.  For tips on how to quit, go to www.smokefree.gov or call 1-800-QUIT-NOW.  Be physically active- Exercise 5 days a week for at least 30 minutes.  If you are not already physically active start slow and gradually work up to 30 minutes of moderate physical activity.  Examples of moderate activity include  walking briskly, mowing the yard, dancing, swimming, bicycling, etc.  Eat a healthy diet- Eat a variety of healthy food such as fruits, vegetables, low fat milk, low fat cheese, yogurt, lean meant, poultry, fish, beans, tofu, etc. For more information go to www.thenutritionsource.org  Drink alcohol in moderation- Limit alcohol intake to less than two drinks a day. Never drink and drive.  Dentist- Brush and floss twice daily; visit your dentist twice a year.  Depression- Your emotional health is as important as your physical health. If you're feeling down, or losing interest in things you would normally enjoy please talk to your healthcare provider.  Eye exam- Visit your eye doctor every year.  Safe sex- If you may be exposed to a sexually transmitted infection, use a condom.  Seat belts- Seat belts can save your life; always wear one.  Smoke/Carbon Monoxide detectors- These detectors need to be installed on the appropriate level of your home.  Replace batteries at least once a year.  Skin cancer- When out in the sun, cover up and use sunscreen 15 SPF or higher.  Violence- If anyone is threatening you, please tell your healthcare provider.  Living Will/ Health care power of attorney- Speak with your healthcare provider and family.  If you have lab work done today you will be contacted with your lab results within the next 2 weeks.  If you have not heard from [place] then please contact us. The fastest way to get your results is to register for My Chart.   IF you received an x-ray today, you will receive an invoice from [facility]. Please contact  [facility] at [phone] with questions or concerns regarding your invoice.   IF you received labwork today, you will receive an invoice from [facility]. Please contact [facility] at [phone] with questions or concerns regarding your invoice.   Our billing staff will not be able to assist you with questions regarding bills from  these companies.  You will be contacted with the lab results as soon as they are available. The fastest way to get your results is to activate your My Chart account. Instructions are located on the last page of this paperwork. If you have not heard from us regarding the results in 2 weeks, please contact this office.          Signed, Meredith StaggersJeffrey Gibbs Naugle, MD [facility]

## 2019-08-15 LAB — CBC
Hematocrit: 49.7 % (ref 37.5–51.0)
Hemoglobin: 16.6 g/dL (ref 13.0–17.7)
MCH: 27 pg (ref 26.6–33.0)
MCHC: 33.4 g/dL (ref 31.5–35.7)
MCV: 81 fL (ref 79–97)
Platelets: 299 10*3/uL (ref 150–450)
RBC: 6.15 x10E6/uL — ABNORMAL HIGH (ref 4.14–5.80)
RDW: 13.5 % (ref 11.6–15.4)
WBC: 7.5 10*3/uL (ref 3.4–10.8)

## 2019-08-15 LAB — COMPREHENSIVE METABOLIC PANEL
ALT: 30 IU/L (ref 0–44)
AST: 20 IU/L (ref 0–40)
Albumin/Globulin Ratio: 2.2 (ref 1.2–2.2)
Albumin: 4.4 g/dL (ref 3.8–4.8)
Alkaline Phosphatase: 71 IU/L (ref 39–117)
BUN/Creatinine Ratio: 13 (ref 10–24)
BUN: 14 mg/dL (ref 8–27)
Bilirubin Total: <0.2 mg/dL (ref 0.0–1.2)
CO2: 22 mmol/L (ref 20–29)
Calcium: 9.7 mg/dL (ref 8.6–10.2)
Chloride: 103 mmol/L (ref 96–106)
Creatinine, Ser: 1.05 mg/dL (ref 0.76–1.27)
GFR calc Af Amer: 86 mL/min/{1.73_m2} (ref >59)
GFR calc non Af Amer: 75 mL/min/{1.73_m2} (ref >59)
Globulin, Total: 2 g/dL (ref 1.5–4.5)
Glucose: 79 mg/dL (ref 65–99)
Potassium: 4.5 mmol/L (ref 3.5–5.2)
Sodium: 139 mmol/L (ref 134–144)
Total Protein: 6.4 g/dL (ref 6.0–8.5)

## 2019-08-15 LAB — LIPID PANEL
Chol/HDL Ratio: 2.5 ratio (ref 0.0–5.0)
Cholesterol, Total: 140 mg/dL (ref 100–199)
HDL: 57 mg/dL (ref >39)
LDL Chol Calc (NIH): 55 mg/dL (ref 0–99)
Triglycerides: 166 mg/dL — ABNORMAL HIGH (ref 0–149)
VLDL Cholesterol Cal: 28 mg/dL (ref 5–40)

## 2019-08-15 LAB — TSH: TSH: 2.94 u[IU]/mL (ref 0.450–4.500)

## 2019-08-22 ENCOUNTER — Encounter: Payer: Self-pay | Admitting: Radiology

## 2020-01-05 ENCOUNTER — Encounter: Payer: Self-pay | Admitting: Family Medicine

## 2020-01-29 ENCOUNTER — Encounter: Payer: Self-pay | Admitting: Family Medicine

## 2020-07-31 ENCOUNTER — Encounter: Payer: Self-pay | Admitting: Family Medicine

## 2020-08-15 ENCOUNTER — Ambulatory Visit (INDEPENDENT_AMBULATORY_CARE_PROVIDER_SITE_OTHER): Payer: Medicare Other | Admitting: Family Medicine

## 2020-08-15 ENCOUNTER — Encounter: Payer: Self-pay | Admitting: Family Medicine

## 2020-08-15 ENCOUNTER — Other Ambulatory Visit: Payer: Self-pay

## 2020-08-15 VITALS — BP 127/76 | HR 85 | Temp 98.3°F | Ht 69.0 in | Wt 191.6 lb

## 2020-08-15 DIAGNOSIS — D751 Secondary polycythemia: Secondary | ICD-10-CM | POA: Diagnosis not present

## 2020-08-15 DIAGNOSIS — E781 Pure hyperglyceridemia: Secondary | ICD-10-CM

## 2020-08-15 DIAGNOSIS — Z125 Encounter for screening for malignant neoplasm of prostate: Secondary | ICD-10-CM

## 2020-08-15 DIAGNOSIS — Z130 Encounter for screening for diseases of the blood and blood-forming organs and certain disorders involving the immune mechanism: Secondary | ICD-10-CM

## 2020-08-15 DIAGNOSIS — Z131 Encounter for screening for diabetes mellitus: Secondary | ICD-10-CM

## 2020-08-15 DIAGNOSIS — Z0000 Encounter for general adult medical examination without abnormal findings: Secondary | ICD-10-CM

## 2020-08-15 DIAGNOSIS — Z23 Encounter for immunization: Secondary | ICD-10-CM

## 2020-08-15 DIAGNOSIS — Z136 Encounter for screening for cardiovascular disorders: Secondary | ICD-10-CM

## 2020-08-15 NOTE — Progress Notes (Signed)
Subjective:  Patient ID: Leonard Holland, male    DOB: 11-04-1955  Age: 65 y.o. MRN: [id number]  CC:  Chief Complaint  Patient presents with  . Medicare Wellness    wants to talk about the p 23 vaccine before he gets it    HPI Tennis Ship presents for  Annual wellness visit, welcome to Medicare physical.  Care team Primary care provider - me ophto- Marica Otter.  Dentist: nephew, Jackson.    No current prescription medications. Stopped working at [employer], hard on feet. Better off work.  Has been caring for 42yo father in law every other week in [place].  He may be in transition. Has been doing yardwork both places.  Has been feeling   Cancer screening Colonoscopy June 2014, Dr. Collene Mares, repeat 10 years Prostate testing discussed last year, deferred, plan for PSA this year. No FH of prostate CA. Parents died of COPD - smokers.  The natural history of prostate cancer and ongoing controversy regarding screening and potential treatment outcomes of prostate cancer has been discussed with the patient. The meaning of a false positive PSA and a false negative PSA has been discussed. He indicates understanding of the limitations of this screening test and wishes to proceed with screening PSA testing as well as DRE.   Immunization History  Administered Date(s) Administered  . Influenza,inj,Quad PF,6+ Mos 06/07/2018, 08/14/2019  . PFIZER SARS-COV-2 Vaccination 01/05/2020, 01/29/2020  . Tdap 06/07/2018  Pneumovax discussed - will have today - deferred prevnar at this time.  Shingles vaccine Shingrix at pharmacy in 2019.  Discussed at physical last year. Covid vaccine booster 2 weeks ago- Moderna.   Fall Risk  08/14/2019 06/07/2018 06/07/2018 05/06/2017 06/30/2016  Falls in the past year? 0 No No No No  Number falls in past yr: 0 - - - -  Injury with Fall? 0 - - - -  Follow up Falls evaluation completed - - - -  no loose rugs at home Adequate lighting.  No stairs. No grab bars.    Depression screen [facility] 2/9 08/14/2019 06/07/2018 06/07/2018 05/06/2017 06/30/2016  Decreased Interest 0 0 0 0 0  Down, Depressed, Hopeless 0 0 0 0 0  PHQ - 2 Score 0 0 0 0 0   Functional Status Survey: Is the patient deaf or have difficulty hearing?: No Does the patient have difficulty seeing, even when wearing glasses/contacts?: No Does the patient have difficulty concentrating, remembering, or making decisions?: No Does the patient have difficulty walking or climbing stairs?: No Does the patient have difficulty dressing or bathing?: No Does the patient have difficulty doing errands alone such as visiting a doctor's office or shopping?: No  6CIT Screen 08/15/2020  What Year? 0 points  What month? 0 points  What time? 0 points  Count back from 20 0 points  Months in reverse 0 points  Repeat phrase 0 points  Total Score 0    Hearing Screening   125Hz  250Hz  500Hz  1000Hz  2000Hz  3000Hz  4000Hz  6000Hz  8000Hz   Right ear:           Left ear:             Visual Acuity Screening   Right eye Left eye Both eyes  Without correction:     With correction: 20/30 20/20 20/25   optho eval 1 year ago.   Dental - every 87month.   Exercise: yardwork as above. walking at times. Stays active. Chopping tree roots.     Office Visit from  08/15/2020 in Primary Care at [facility]  AUDIT-C Score 1     Advance directives - has living will, hcpoa.   Borderline triglycerides and RBC on prior labs.   Lab Results  Component Value Date   CHOL 140 08/14/2019   HDL 57 08/14/2019   LDLCALC 55 08/14/2019   TRIG 166 (H) 08/14/2019   CHOLHDL 2.5 08/14/2019   Lab Results  Component Value Date   WBC 7.5 08/14/2019   HGB 16.6 08/14/2019   HCT 49.7 08/14/2019   MCV 81 08/14/2019   PLT 299 08/14/2019    History There are no problems to display for this patient.  Past Medical History:  Diagnosis Date  . Anxiety   . Depression   . Depression   . Heart murmur   . Migraine   . Migraines   . Mitral  valve prolapse   . [place] spotted fever    Past Surgical History:  Procedure Laterality Date  . HERNIA REPAIR    . VASECTOMY    . WRIST SURGERY Right    tendon chief release   Allergies  Allergen Reactions  . Penicillins Other (See Comments)    convulsions   Prior to Admission medications   Not on File   Social History   Socioeconomic History  . Marital status: Married    Spouse name: Not on file  . Number of children: Not on file  . Years of education: Not on file  . Highest education level: Not on file  Occupational History  . Not on file  Tobacco Use  . Smoking status: Never Smoker  . Smokeless tobacco: Never Used  Vaping Use  . Vaping Use: Never used  Substance and Sexual Activity  . Alcohol use: Yes    Alcohol/week: 1.0 standard drink    Types: 1 Cans of beer per week  . Drug use: No  . Sexual activity: Yes    Birth control/protection: None  Other Topics Concern  . Not on file  Social History Narrative  . Not on file   Social Determinants of Health   Financial Resource Strain:   . Difficulty of Paying Living Expenses: Not on file  Food Insecurity:   . Worried About Charity fundraiser in the Last Year: Not on file  . Ran Out of Food in the Last Year: Not on file  Transportation Needs:   . Lack of Transportation (Medical): Not on file  . Lack of Transportation (Non-Medical): Not on file  Physical Activity:   . Days of Exercise per Week: Not on file  . Minutes of Exercise per Session: Not on file  Stress:   . Feeling of Stress : Not on file  Social Connections:   . Frequency of Communication with Friends and Family: Not on file  . Frequency of Social Gatherings with Friends and Family: Not on file  . Attends Religious Services: Not on file  . Active Member of Clubs or Organizations: Not on file  . Attends Archivist Meetings: Not on file  . Marital Status: Not on file  Intimate Partner Violence:   . Fear of Current or  Ex-Partner: Not on file  . Emotionally Abused: Not on file  . Physically Abused: Not on file  . Sexually Abused: Not on file    Review of Systems 13 point review of systems per patient health survey noted.  Negative other than as indicated above or in HPI.    Objective:   Vitals:  08/15/20 1455  BP: 127/76  Pulse: 85  Temp: 98.3 F (36.8 C)  SpO2: 97%  Weight: 191 lb 9.6 oz (86.9 kg)  Height: 5' 9"  (1.753 m)     Physical Exam Vitals reviewed.  Constitutional:      Appearance: He is well-developed.  HENT:     Head: Normocephalic and atraumatic.     Right Ear: External ear normal.     Left Ear: External ear normal.  Eyes:     Conjunctiva/sclera: Conjunctivae normal.     Pupils: Pupils are equal, round, and reactive to light.  Neck:     Thyroid: No thyromegaly.  Cardiovascular:     Rate and Rhythm: Normal rate and regular rhythm.     Heart sounds: Normal heart sounds.  Pulmonary:     Effort: Pulmonary effort is normal. No respiratory distress.     Breath sounds: Normal breath sounds. No wheezing.  Abdominal:     General: There is no distension.     Palpations: Abdomen is soft.     Tenderness: There is no abdominal tenderness.     Hernia: A hernia is present. Hernia is present in the right inguinal area (possible bulge with cough. valsalva -  easily reslolves. prior repair. ). There is no hernia in the left inguinal area.  Genitourinary:    Prostate: Normal.  Musculoskeletal:        General: No tenderness. Normal range of motion.     Cervical back: Normal range of motion and neck supple.  Lymphadenopathy:     Cervical: No cervical adenopathy.  Skin:    General: Skin is warm and dry.  Neurological:     Mental Status: He is alert and oriented to person, place, and time.     Deep Tendon Reflexes: Reflexes are normal and symmetric.  Psychiatric:        Behavior: Behavior normal.     EKG: SR, no acute findings. Rate 82. No apparent changes from 01/18/13.     Assessment & Plan:  BRAYTON BAUMGARTNER is a 64 y.o. male . Welcome to Medicare preventive visit  - - anticipatory guidance as below in AVS, screening labs if needed. Health maintenance items as above in HPI discussed/recommended as applicable.  - no concerning responses on depression, fall, or functional status screening. Any positive responses noted as above. Advanced directives discussed as in CHL.   -Question of possible right inguinal hernia, asymptomatic.  Option of surgery eval for repeat exam.  Previous repair.  Screening, anemia, deficiency, iron Erythrocytosis - Plan: CBC with Differential/Platelet  -Mild elevated RBC prior, repeat CBC with differential.  screening for diabetes mellitus - Plan: Comprehensive metabolic panel  Needs flu shot - Plan: Flu Vaccine QUAD High Dose(Fluad)  Need for pneumococcal vaccine - Plan: Pneumococcal polysaccharide vaccine 23-valent greater than or equal to 2yo subcutaneous/IM  Screening for cardiovascular condition - Plan: EKG 12-Lead  -No concerning findings on EKG.  Asymptomatic.  Hypertriglyceridemia - Plan: Lipid panel, Comprehensive metabolic panel  -Mild hypertriglyceridemia previously, check lipids.  Screening for prostate cancer - Plan: PSA  -We discussed pros and cons of prostate cancer screening, and after this discussion, he chose to have screening done. PSA obtained, and no concerning findings on DRE.    No orders of the defined types were placed in this encounter.  Patient Instructions   There may be a small hernia on the right, let me know if you want to meet with a surgeon to repeat test. Return to the  clinic or go to the nearest emergency room if any of your symptoms worsen or new symptoms occur.  Thanks for coming in today.   Preventive Care 50 Years and Older, Male Preventive care refers to lifestyle choices and visits with your health care provider that can promote health and wellness. This includes:  A yearly physical  exam. This is also called an annual well check.  Regular dental and eye exams.  Immunizations.  Screening for certain conditions.  Healthy lifestyle choices, such as diet and exercise. What can I expect for my preventive care visit? Physical exam Your health care provider will check:  Height and weight. These may be used to calculate body mass index (BMI), which is a measurement that tells if you are at a healthy weight.  Heart rate and blood pressure.  Your skin for abnormal spots. Counseling Your health care provider may ask you questions about:  Alcohol, tobacco, and drug use.  Emotional well-being.  Home and relationship well-being.  Sexual activity.  Eating habits.  History of falls.  Memory and ability to understand (cognition).  Work and work Statistician. What immunizations do I need?  Influenza (flu) vaccine  This is recommended every year. Tetanus, diphtheria, and pertussis (Tdap) vaccine  You may need a Td booster every 10 years. Varicella (chickenpox) vaccine  You may need this vaccine if you have not already been vaccinated. Zoster (shingles) vaccine  You may need this after age 73. Pneumococcal conjugate (PCV13) vaccine  One dose is recommended after age 97. Pneumococcal polysaccharide (PPSV23) vaccine  One dose is recommended after age 66. Measles, mumps, and rubella (MMR) vaccine  You may need at least one dose of MMR if you were born in 1957 or later. You may also need a second dose. Meningococcal conjugate (MenACWY) vaccine  You may need this if you have certain conditions. Hepatitis A vaccine  You may need this if you have certain conditions or if you travel or work in places where you may be exposed to hepatitis A. Hepatitis B vaccine  You may need this if you have certain conditions or if you travel or work in places where you may be exposed to hepatitis B. Haemophilus influenzae type b (Hib) vaccine  You may need this if you  have certain conditions. You may receive vaccines as individual doses or as more than one vaccine together in one shot (combination vaccines). Talk with your health care provider about the risks and benefits of combination vaccines. What tests do I need? Blood tests  Lipid and cholesterol levels. These may be checked every 5 years, or more frequently depending on your overall health.  Hepatitis C test.  Hepatitis B test. Screening  Lung cancer screening. You may have this screening every year starting at age 30 if you have a 30-pack-year history of smoking and currently smoke or have quit within the past 15 years.  Colorectal cancer screening. All adults should have this screening starting at age 42 and continuing until age 40. Your health care provider may recommend screening at age 68 if you are at increased risk. You will have tests every 1-10 years, depending on your results and the type of screening test.  Prostate cancer screening. Recommendations will vary depending on your family history and other risks.  Diabetes screening. This is done by checking your blood sugar (glucose) after you have not eaten for a while (fasting). You may have this done every 1-3 years.  Abdominal aortic aneurysm (AAA) screening. You  may need this if you are a current or former smoker.  Sexually transmitted disease (STD) testing. Follow these instructions at home: Eating and drinking  Eat a diet that includes fresh fruits and vegetables, whole grains, lean protein, and low-fat dairy products. Limit your intake of foods with high amounts of sugar, saturated fats, and salt.  Take vitamin and mineral supplements as recommended by your health care provider.  Do not drink alcohol if your health care provider tells you not to drink.  If you drink alcohol: ? Limit how much you have to 0-2 drinks a day. ? Be aware of how much alcohol is in your drink. In the [place] one drink equals one 12 oz bottle of beer  (355 mL), one 5 oz glass of wine (148 mL), or one 1 oz glass of hard liquor (44 mL). Lifestyle  Take daily care of your teeth and gums.  Stay active. Exercise for at least 30 minutes on 5 or more days each week.  Do not use any products that contain nicotine or tobacco, such as cigarettes, e-cigarettes, and chewing tobacco. If you need help quitting, ask your health care provider.  If you are sexually active, practice safe sex. Use a condom or other form of protection to prevent STIs (sexually transmitted infections).  Talk with your health care provider about taking a low-dose aspirin or statin. What's next?  Visit your health care provider once a year for a well check visit.  Ask your health care provider how often you should have your eyes and teeth checked.  Stay up to date on all vaccines. This information is not intended to replace advice given to you by your health care provider. Make sure you discuss any questions you have with your health care provider. Document Revised: 09/08/2018 Document Reviewed: 09/08/2018 Elsevier Patient Education  [employer].    If you have lab work done today you will be contacted with your lab results within the next 2 weeks.  If you have not heard from [place] then please contact us. The fastest way to get your results is to register for My Chart.   IF you received an x-ray today, you will receive an invoice from [facility]. Please contact [facility] at [phone] with questions or concerns regarding your invoice.   IF you received labwork today, you will receive an invoice from [place]. Please contact [facility] at [phone] with questions or concerns regarding your invoice.   Our billing staff will not be able to assist you with questions regarding bills from these companies.  You will be contacted with the lab results as soon as they are available. The fastest way to get your results is to activate your My Chart  account. Instructions are located on the last page of this paperwork. If you have not heard from [place] regarding the results in 2 weeks, please contact this office.         Signed, [name], MD [facility]

## 2020-08-15 NOTE — Patient Instructions (Addendum)
There may be a small hernia on the right, let me know if you want to meet with a surgeon to repeat test. Return to the clinic or go to the nearest emergency room if any of your symptoms worsen or new symptoms occur.  Thanks for coming in today.   Preventive Care 25 Years and Older, Male Preventive care refers to lifestyle choices and visits with your health care provider that can promote health and wellness. This includes:  A yearly physical exam. This is also called an annual well check.  Regular dental and eye exams.  Immunizations.  Screening for certain conditions.  Healthy lifestyle choices, such as diet and exercise. What can I expect for my preventive care visit? Physical exam Your health care provider will check:  Height and weight. These may be used to calculate body mass index (BMI), which is a measurement that tells if you are at a healthy weight.  Heart rate and blood pressure.  Your skin for abnormal spots. Counseling Your health care provider may ask you questions about:  Alcohol, tobacco, and drug use.  Emotional well-being.  Home and relationship well-being.  Sexual activity.  Eating habits.  History of falls.  Memory and ability to understand (cognition).  Work and work Statistician. What immunizations do I need?  Influenza (flu) vaccine  This is recommended every year. Tetanus, diphtheria, and pertussis (Tdap) vaccine  You may need a Td booster every 10 years. Varicella (chickenpox) vaccine  You may need this vaccine if you have not already been vaccinated. Zoster (shingles) vaccine  You may need this after age 33. Pneumococcal conjugate (PCV13) vaccine  One dose is recommended after age 53. Pneumococcal polysaccharide (PPSV23) vaccine  One dose is recommended after age 86. Measles, mumps, and rubella (MMR) vaccine  You may need at least one dose of MMR if you were born in 1957 or later. You may also need a second dose. Meningococcal  conjugate (MenACWY) vaccine  You may need this if you have certain conditions. Hepatitis A vaccine  You may need this if you have certain conditions or if you travel or work in places where you may be exposed to hepatitis A. Hepatitis B vaccine  You may need this if you have certain conditions or if you travel or work in places where you may be exposed to hepatitis B. Haemophilus influenzae type b (Hib) vaccine  You may need this if you have certain conditions. You may receive vaccines as individual doses or as more than one vaccine together in one shot (combination vaccines). Talk with your health care provider about the risks and benefits of combination vaccines. What tests do I need? Blood tests  Lipid and cholesterol levels. These may be checked every 5 years, or more frequently depending on your overall health.  Hepatitis C test.  Hepatitis B test. Screening  Lung cancer screening. You may have this screening every year starting at age 10 if you have a 30-pack-year history of smoking and currently smoke or have quit within the past 15 years.  Colorectal cancer screening. All adults should have this screening starting at age 71 and continuing until age 51. Your health care provider may recommend screening at age 66 if you are at increased risk. You will have tests every 1-10 years, depending on your results and the type of screening test.  Prostate cancer screening. Recommendations will vary depending on your family history and other risks.  Diabetes screening. This is done by checking your blood sugar (  glucose) after you have not eaten for a while (fasting). You may have this done every 1-3 years.  Abdominal aortic aneurysm (AAA) screening. You may need this if you are a current or former smoker.  Sexually transmitted disease (STD) testing. Follow these instructions at home: Eating and drinking  Eat a diet that includes fresh fruits and vegetables, whole grains, lean  protein, and low-fat dairy products. Limit your intake of foods with high amounts of sugar, saturated fats, and salt.  Take vitamin and mineral supplements as recommended by your health care provider.  Do not drink alcohol if your health care provider tells you not to drink.  If you drink alcohol: ? Limit how much you have to 0-2 drinks a day. ? Be aware of how much alcohol is in your drink. In the [place] one drink equals one 12 oz bottle of beer (355 mL), one 5 oz glass of wine (148 mL), or one 1 oz glass of hard liquor (44 mL). Lifestyle  Take daily care of your teeth and gums.  Stay active. Exercise for at least 30 minutes on 5 or more days each week.  Do not use any products that contain nicotine or tobacco, such as cigarettes, e-cigarettes, and chewing tobacco. If you need help quitting, ask your health care provider.  If you are sexually active, practice safe sex. Use a condom or other form of protection to prevent STIs (sexually transmitted infections).  Talk with your health care provider about taking a low-dose aspirin or statin. What's next?  Visit your health care provider once a year for a well check visit.  Ask your health care provider how often you should have your eyes and teeth checked.  Stay up to date on all vaccines. This information is not intended to replace advice given to you by your health care provider. Make sure you discuss any questions you have with your health care provider. Document Revised: 09/08/2018 Document Reviewed: 09/08/2018 Elsevier Patient Education  [employer].    If you have lab work done today you will be contacted with your lab results within the next 2 weeks.  If you have not heard from [place] then please contact us. The fastest way to get your results is to register for My Chart.   IF you received an x-ray today, you will receive an invoice from [facility]. Please contact [facility] at [phone] with  questions or concerns regarding your invoice.   IF you received labwork today, you will receive an invoice from [facility]. Please contact [facility] at [phone] with questions or concerns regarding your invoice.   Our billing staff will not be able to assist you with questions regarding bills from these companies.  You will be contacted with the lab results as soon as they are available. The fastest way to get your results is to activate your My Chart account. Instructions are located on the last page of this paperwork. If you have not heard from [place] regarding the results in 2 weeks, please contact this office.

## 2020-08-16 LAB — CBC WITH DIFFERENTIAL/PLATELET
Basophils Absolute: 0.1 10*3/uL (ref 0.0–0.2)
Basos: 1 %
EOS (ABSOLUTE): 0.3 10*3/uL (ref 0.0–0.4)
Eos: 4 %
Hematocrit: 47.5 % (ref 37.5–51.0)
Hemoglobin: 15.7 g/dL (ref 13.0–17.7)
Immature Grans (Abs): 0.1 10*3/uL (ref 0.0–0.1)
Immature Granulocytes: 1 %
Lymphocytes Absolute: 1.3 10*3/uL (ref 0.7–3.1)
Lymphs: 16 %
MCH: 26.9 pg (ref 26.6–33.0)
MCHC: 33.1 g/dL (ref 31.5–35.7)
MCV: 81 fL (ref 79–97)
Monocytes Absolute: 0.7 10*3/uL (ref 0.1–0.9)
Monocytes: 8 %
Neutrophils Absolute: 5.7 10*3/uL (ref 1.4–7.0)
Neutrophils: 70 %
Platelets: 317 10*3/uL (ref 150–450)
RBC: 5.84 x10E6/uL — ABNORMAL HIGH (ref 4.14–5.80)
RDW: 13.6 % (ref 11.6–15.4)
WBC: 8.2 10*3/uL (ref 3.4–10.8)

## 2020-08-16 LAB — LIPID PANEL
Chol/HDL Ratio: 3.6 ratio (ref 0.0–5.0)
Cholesterol, Total: 140 mg/dL (ref 100–199)
HDL: 39 mg/dL — ABNORMAL LOW (ref >39)
LDL Chol Calc (NIH): 46 mg/dL (ref 0–99)
Triglycerides: 371 mg/dL — ABNORMAL HIGH (ref 0–149)
VLDL Cholesterol Cal: 55 mg/dL — ABNORMAL HIGH (ref 5–40)

## 2020-08-16 LAB — COMPREHENSIVE METABOLIC PANEL
ALT: 28 IU/L (ref 0–44)
AST: 22 IU/L (ref 0–40)
Albumin/Globulin Ratio: 2 (ref 1.2–2.2)
Albumin: 4.5 g/dL (ref 3.8–4.8)
Alkaline Phosphatase: 72 IU/L (ref 44–121)
BUN/Creatinine Ratio: 15 (ref 10–24)
BUN: 16 mg/dL (ref 8–27)
Bilirubin Total: 0.2 mg/dL (ref 0.0–1.2)
CO2: 21 mmol/L (ref 20–29)
Calcium: 9.8 mg/dL (ref 8.6–10.2)
Chloride: 103 mmol/L (ref 96–106)
Creatinine, Ser: 1.09 mg/dL (ref 0.76–1.27)
GFR calc Af Amer: 82 mL/min/{1.73_m2} (ref >59)
GFR calc non Af Amer: 71 mL/min/{1.73_m2} (ref >59)
Globulin, Total: 2.3 g/dL (ref 1.5–4.5)
Glucose: 90 mg/dL (ref 65–99)
Potassium: 4.5 mmol/L (ref 3.5–5.2)
Sodium: 140 mmol/L (ref 134–144)
Total Protein: 6.8 g/dL (ref 6.0–8.5)

## 2020-08-16 LAB — PSA: Prostate Specific Ag, Serum: 1.6 ng/mL (ref 0.0–4.0)

## 2020-08-25 ENCOUNTER — Encounter: Payer: Self-pay | Admitting: Family Medicine

## 2020-08-26 NOTE — Telephone Encounter (Signed)
Pt comment about lab values.  Often eats fast food, does do some outdoor work.  Agreeable to meds if they are recommended.  Please advise

## 2021-08-20 ENCOUNTER — Encounter: Payer: Self-pay | Admitting: Family Medicine

## 2021-10-30 ENCOUNTER — Encounter: Payer: Medicare Other | Admitting: Family Medicine

## 2021-12-10 ENCOUNTER — Telehealth: Payer: Self-pay | Admitting: Family Medicine

## 2021-12-10 NOTE — Telephone Encounter (Signed)
Called patient to get 01/23/22 appointment rescheduled.  ? ?Patient is very upset & said it was the 3rd or 4th time this appointment has been moved. ? ?Patient was loud over the phone & stated "what is wrong with you people?" ? ?Patient will not reschedule & said he & his wife would find another provider ?

## 2021-12-31 ENCOUNTER — Encounter: Payer: Self-pay | Admitting: Family Medicine

## 2021-12-31 ENCOUNTER — Ambulatory Visit (INDEPENDENT_AMBULATORY_CARE_PROVIDER_SITE_OTHER): Payer: Medicare Other | Admitting: Family Medicine

## 2021-12-31 VITALS — BP 120/72 | HR 95 | Temp 97.8°F | Ht 69.0 in | Wt 194.0 lb

## 2021-12-31 DIAGNOSIS — Z0000 Encounter for general adult medical examination without abnormal findings: Secondary | ICD-10-CM

## 2021-12-31 DIAGNOSIS — Z23 Encounter for immunization: Secondary | ICD-10-CM | POA: Diagnosis not present

## 2021-12-31 DIAGNOSIS — Z8719 Personal history of other diseases of the digestive system: Secondary | ICD-10-CM

## 2021-12-31 DIAGNOSIS — E781 Pure hyperglyceridemia: Secondary | ICD-10-CM | POA: Diagnosis not present

## 2021-12-31 DIAGNOSIS — Z9889 Other specified postprocedural states: Secondary | ICD-10-CM

## 2021-12-31 DIAGNOSIS — Z125 Encounter for screening for malignant neoplasm of prostate: Secondary | ICD-10-CM

## 2021-12-31 DIAGNOSIS — Z1329 Encounter for screening for other suspected endocrine disorder: Secondary | ICD-10-CM | POA: Diagnosis not present

## 2021-12-31 NOTE — Progress Notes (Signed)
? ?Subjective:  ? ? Patient ID: Leonard Holland, male    DOB: [birth year], 66 y.o.   MRN: [id number] ? ?HPI ?Chief Complaint  ?Patient presents with  ? Establish Care  ? Annual Exam  ?  Would like physical and discuss 2nd pneumonia vaccine. Dr Chilton Si suspects hernia in right groin area.  ? ?He is new to the practice and here for a complete physical exam. ?Previous medical care: Dr. Neva Seat  ? ?Other providers: ?Eyes- Dr. Blima Ledger  ?Dentist- Dr. Lynita Lombard in [place] [facility]-  ? ? ?Elevated triglycerides in the past, unclear as to whether he was fasting.  ? ?Questionable right inquinal hernia with hx of repair in 1996. Denies pain or pressure.  ? ?Social history: Lives with wife, retired from Animator work  ?Denies smoking, drinking alcohol, drug use ?Diet: healthy. GOLO. Stopped soft drinks. Doing Hello Fresh  ?Exercise: regular exercise.  ? ?Immunizations: needs pneumonia vaccine  ? ?Health maintenance:   ?Colonoscopy: UTD. Recall in 2029  ?Last PSA: normal in 2021  ?Last Dental Exam: UTD  ?Last Eye Exam: UTD  ? ?Wears seatbelt always, uses sunscreen, smoke detectors in home and functioning, does not text while driving, feels safe in home environment. ? ?Reviewed allergies, medications, past medical, surgical, family, and social history. ? ? ?Review of Systems ?Review of Systems ?Constitutional: -fever, -chills, -sweats, -unexpected weight change,-fatigue ?ENT: -runny nose, -ear pain, -sore throat ?Cardiology:  -chest pain, -palpitations, -edema ?Respiratory: -cough, -shortness of breath, -wheezing ?Gastroenterology: -abdominal pain, -nausea, -vomiting, -diarrhea, -constipation Hematology: -bleeding or bruising problems ?Musculoskeletal: -arthralgias, -myalgias, -joint swelling, -back pain ?Ophthalmology: -vision changes ?Urology: -dysuria, -difficulty urinating, -hematuria, -urinary frequency, -urgency ?Neurology: -headache, -weakness, -tingling, -numbness ?  ? ?   ?Objective:  ? Physical Exam ?BP 120/72 (BP Location: Left Arm,  Patient Position: Sitting, Cuff Size: Large)   Pulse 95   Temp 97.8 ?F (36.6 ?C) (Temporal)   Ht 5\' 9"  (1.753 m)   Wt 194 lb (88 kg)   SpO2 97%   BMI 28.65 kg/m?  ? ?General Appearance:    Alert, cooperative, no distress, appears stated age  ?Head:    Normocephalic, without obvious abnormality, atraumatic  ?Eyes:    PERRL, conjunctiva/corneas clear, EOM's intact  ?Ears:    Normal TM's and external ear canals  ?Nose:   Nares normal, mucosa normal, no drainage or sinus   tenderness  ?Throat:   Lips, mucosa, and tongue normal; teeth and gums normal  ?Neck:   Supple, no lymphadenopathy;  thyroid:  no   enlargement/tenderness/nodules; no JVD  ?Back:    Spine nontender, no curvature, ROM normal, no CVA     tenderness  ?Lungs:     Clear to auscultation bilaterally without wheezes, rales or     ronchi; respirations unlabored  ?Chest Wall:    No tenderness or deformity  ? Heart:    Regular rate and rhythm  ?Breast Exam:    No chest wall tenderness, masses or gynecomastia  ?Abdomen:     Soft, non-tender, nondistended, normoactive bowel sounds,  ?  no masses, no hepatosplenomegaly  ?Genitalia:    Not done   ?Rectal:    Not done   ?Extremities:   No clubbing, cyanosis or edema  ?Pulses:   2+ and symmetric all extremities  ?Skin:   Skin color, texture, turgor normal, no rashes or lesions  ?Lymph nodes:   Cervical, supraclavicular, and axillary nodes normal  ?Neurologic:   CNII-XII intact, normal strength, sensation and gait  ?Psych:  Normal mood, affect, hygiene and grooming.   ? ? ?   ?Assessment & Plan:  ?Routine general medical examination at a health care facility - Plan: CBC with Differential/Platelet, Comprehensive metabolic panel, TSH, Lipid panel ?-He is a pleasant 66 year old male who is new to the practice and here today for a CPE.  He is not fasting and will return for labs.  Preventive health care reviewed and he is up-to-date.  Screening for prostate cancer with PSA today.  Immunizations reviewed.  He will  continue taking good care of himself with healthy diet and exercise.  Discussed safety. ? ?Need for vaccination against Streptococcus pneumoniae - Plan: Pneumococcal polysaccharide vaccine 23-valent greater than or equal to 2yo subcutaneous/IM ?-Discussed possible side effects ? ?Hypertriglyceridemia - Plan: Lipid panel ?-He has made healthy dietary changes.  Return fasting for lipid panel and follow-up pending results ? ?Screening for thyroid disorder - Plan: TSH ? ?Screening for prostate cancer - Plan: PSA, Medicare ? ?Hx of inguinal hernia repair ?-Currently asymptomatic.  Avoid heavy lifting.  Follow-up as needed. ? ? ?

## 2021-12-31 NOTE — Patient Instructions (Signed)
Schedule a return fasting lab visit.  ? ?Continue taking good care of yourself.  ? ?We will be in touch with your results.  ? ? ?Preventive Care 27 Years and Older, Male ?Preventive care refers to lifestyle choices and visits with your health care provider that can promote health and wellness. Preventive care visits are also called wellness exams. ?What can I expect for my preventive care visit? ?Counseling ?During your preventive care visit, your health care provider may ask about your: ?Medical history, including: ?Past medical problems. ?Family medical history. ?History of falls. ?Current health, including: ?Emotional well-being. ?Home life and relationship well-being. ?Sexual activity. ?Memory and ability to understand (cognition). ?Lifestyle, including: ?Alcohol, nicotine or tobacco, and drug use. ?Access to firearms. ?Diet, exercise, and sleep habits. ?Work and work Statistician. ?Sunscreen use. ?Safety issues such as seatbelt and bike helmet use. ?Physical exam ?Your health care provider will check your: ?Height and weight. These may be used to calculate your BMI (body mass index). BMI is a measurement that tells if you are at a healthy weight. ?Waist circumference. This measures the distance around your waistline. This measurement also tells if you are at a healthy weight and may help predict your risk of certain diseases, such as type 2 diabetes and high blood pressure. ?Heart rate and blood pressure. ?Body temperature. ?Skin for abnormal spots. ?What immunizations do I need? ?Vaccines are usually given at various ages, according to a schedule. Your health care provider will recommend vaccines for you based on your age, medical history, and lifestyle or other factors, such as travel or where you work. ?What tests do I need? ?Screening ?Your health care provider may recommend screening tests for certain conditions. This may include: ?Lipid and cholesterol levels. ?Diabetes screening. This is done by checking  your blood sugar (glucose) after you have not eaten for a while (fasting). ?Hepatitis C test. ?Hepatitis B test. ?HIV (human immunodeficiency virus) test. ?STI (sexually transmitted infection) testing, if you are at risk. ?Lung cancer screening. ?Colorectal cancer screening. ?Prostate cancer screening. ?Abdominal aortic aneurysm (AAA) screening. You may need this if you are a current or former smoker. ?Talk with your health care provider about your test results, treatment options, and if necessary, the need for more tests. ?Follow these instructions at home: ?Eating and drinking ? ?Eat a diet that includes fresh fruits and vegetables, whole grains, lean protein, and low-fat dairy products. Limit your intake of foods with high amounts of sugar, saturated fats, and salt. ?Take vitamin and mineral supplements as recommended by your health care provider. ?Do not drink alcohol if your health care provider tells you not to drink. ?If you drink alcohol: ?Limit how much you have to 0-2 drinks a day. ?Know how much alcohol is in your drink. In the [place] one drink equals one 12 oz bottle of beer (355 mL), one 5 oz glass of wine (148 mL), or one 1? oz glass of hard liquor (44 mL). ?Lifestyle ?Brush your teeth every morning and night with fluoride toothpaste. Floss one time each day. ?Exercise for at least 30 minutes 5 or more days each week. ?Do not use any products that contain nicotine or tobacco. These products include cigarettes, chewing tobacco, and vaping devices, such as e-cigarettes. If you need help quitting, ask your health care provider. ?Do not use drugs. ?If you are sexually active, practice safe sex. Use a condom or other form of protection to prevent STIs. ?Take aspirin only as told by your health care provider. Make  sure that you understand how much to take and what form to take. Work with your health care provider to find out whether it is safe and beneficial for you to take aspirin daily. ?Ask your health  care provider if you need to take a cholesterol-lowering medicine (statin). ?Find healthy ways to manage stress, such as: ?Meditation, yoga, or listening to music. ?Journaling. ?Talking to a trusted person. ?Spending time with friends and family. ?Safety ?Always wear your seat belt while driving or riding in a vehicle. ?Do not drive: ?If you have been drinking alcohol. Do not ride with someone who has been drinking. ?When you are tired or distracted. ?While texting. ?If you have been using any mind-altering substances or drugs. ?Wear a helmet and other protective equipment during sports activities. ?If you have firearms in your house, make sure you follow all gun safety procedures. ?Minimize exposure to UV radiation to reduce your risk of skin cancer. ?What's next? ?Visit your health care provider once a year for an annual wellness visit. ?Ask your health care provider how often you should have your eyes and teeth checked. ?Stay up to date on all vaccines. ?This information is not intended to replace advice given to you by your health care provider. Make sure you discuss any questions you have with your health care provider. ?Document Revised: 03/12/2021 Document Reviewed: 03/12/2021 ?Elsevier Patient Education ? [place]. ? ?

## 2022-01-01 ENCOUNTER — Other Ambulatory Visit (INDEPENDENT_AMBULATORY_CARE_PROVIDER_SITE_OTHER): Payer: Medicare Other

## 2022-01-01 DIAGNOSIS — Z125 Encounter for screening for malignant neoplasm of prostate: Secondary | ICD-10-CM | POA: Diagnosis not present

## 2022-01-01 DIAGNOSIS — Z1329 Encounter for screening for other suspected endocrine disorder: Secondary | ICD-10-CM | POA: Diagnosis not present

## 2022-01-01 DIAGNOSIS — E781 Pure hyperglyceridemia: Secondary | ICD-10-CM | POA: Diagnosis not present

## 2022-01-01 DIAGNOSIS — Z0000 Encounter for general adult medical examination without abnormal findings: Secondary | ICD-10-CM

## 2022-01-01 LAB — CBC WITH DIFFERENTIAL/PLATELET
Basophils Absolute: 0.1 10*3/uL (ref 0.0–0.1)
Basophils Relative: 0.9 % (ref 0.0–3.0)
Eosinophils Absolute: 0.3 10*3/uL (ref 0.0–0.7)
Eosinophils Relative: 3.1 % (ref 0.0–5.0)
HCT: 46.5 % (ref 39.0–52.0)
Hemoglobin: 15.4 g/dL (ref 13.0–17.0)
Lymphocytes Relative: 15.4 % (ref 12.0–46.0)
Lymphs Abs: 1.3 10*3/uL (ref 0.7–4.0)
MCHC: 33.2 g/dL (ref 30.0–36.0)
MCV: 81.4 fl (ref 78.0–100.0)
Monocytes Absolute: 0.7 10*3/uL (ref 0.1–1.0)
Monocytes Relative: 9.1 % (ref 3.0–12.0)
Neutro Abs: 5.8 10*3/uL (ref 1.4–7.7)
Neutrophils Relative %: 71.5 % (ref 43.0–77.0)
Platelets: 244 10*3/uL (ref 150.0–400.0)
RBC: 5.7 Mil/uL (ref 4.22–5.81)
RDW: 13.9 % (ref 11.5–15.5)
WBC: 8.1 10*3/uL (ref 4.0–10.5)

## 2022-01-01 LAB — COMPREHENSIVE METABOLIC PANEL
ALT: 19 U/L (ref 0–53)
AST: 14 U/L (ref 0–37)
Albumin: 4.3 g/dL (ref 3.5–5.2)
Alkaline Phosphatase: 58 U/L (ref 39–117)
BUN: 23 mg/dL (ref 6–23)
CO2: 25 mEq/L (ref 19–32)
Calcium: 9.7 mg/dL (ref 8.4–10.5)
Chloride: 106 mEq/L (ref 96–112)
Creatinine, Ser: 1.21 mg/dL (ref 0.40–1.50)
GFR: 62.3 mL/min (ref >60.00)
Glucose, Bld: 91 mg/dL (ref 70–99)
Potassium: 4.5 mEq/L (ref 3.5–5.1)
Sodium: 140 mEq/L (ref 135–145)
Total Bilirubin: 0.8 mg/dL (ref 0.2–1.2)
Total Protein: 6.4 g/dL (ref 6.0–8.3)

## 2022-01-01 LAB — LIPID PANEL
Cholesterol: 132 mg/dL (ref 0–200)
HDL: 49.2 mg/dL (ref >39.00)
LDL Cholesterol: 69 mg/dL (ref 0–99)
NonHDL: 82.85
Total CHOL/HDL Ratio: 3
Triglycerides: 70 mg/dL (ref 0.0–149.0)
VLDL: 14 mg/dL (ref 0.0–40.0)

## 2022-01-01 LAB — PSA, MEDICARE: PSA: 2.38 ng/ml (ref 0.10–4.00)

## 2022-01-01 LAB — TSH: TSH: 1.94 u[IU]/mL (ref 0.35–5.50)

## 2022-01-23 ENCOUNTER — Encounter: Payer: Medicare Other | Admitting: Family Medicine

## 2022-06-22 ENCOUNTER — Encounter: Payer: Self-pay | Admitting: Family Medicine

## 2022-06-22 NOTE — Telephone Encounter (Signed)
Pt is not due for pneumonia vaccine.   Do you recommend pt get RSV vaccine?

## 2022-06-26 ENCOUNTER — Ambulatory Visit (INDEPENDENT_AMBULATORY_CARE_PROVIDER_SITE_OTHER): Payer: Medicare Other

## 2022-06-26 VITALS — Wt 198.0 lb

## 2022-06-26 DIAGNOSIS — Z0000 Encounter for general adult medical examination without abnormal findings: Secondary | ICD-10-CM

## 2022-06-26 NOTE — Progress Notes (Signed)
Subjective:   Leonard Holland is a 66 y.o. male who presents for Medicare Annual/Subsequent preventive examination.  I connected with  [name] on 06/26/22 by a audio enabled telemedicine application and verified that I am speaking with the correct person using two identifiers.  Patient Location: Home  Provider Location: Home Office  I discussed the limitations of evaluation and management by telemedicine. The patient expressed understanding and agreed to proceed.   Review of Systems     Cardiac Risk Factors include: advanced age (>25men, >14 women);male gender     Objective:    Today's Vitals   06/26/22 0858  Weight: 198 lb (89.8 kg)   Body mass index is 29.24 kg/m.     06/26/2022    9:05 AM 08/15/2020    2:54 PM  Advanced Directives  Does Patient Have a Medical Advance Directive? Yes Yes  Type of Estate agent of [place];Living will   Does patient want to make changes to medical advance directive?  Yes (ED - Information included in AVS)  Copy of Healthcare Power of Attorney in Chart? No - copy requested     Current Medications (verified) No outpatient encounter medications on file as of 06/26/2022.   No facility-administered encounter medications on file as of 06/26/2022.    Allergies (verified) Penicillins   History: Past Medical History:  Diagnosis Date   Anxiety    Depression    Depression    Heart murmur    Migraine    Migraines    Mitral valve prolapse    [place] spotted fever    Past Surgical History:  Procedure Laterality Date   HERNIA REPAIR     VASECTOMY     WRIST SURGERY Right    tendon chief release   Family History  Problem Relation Age of Onset   Cancer Father    Social History   Socioeconomic History   Marital status: Married    Spouse name: Maralyn Sago   Number of children: 1   Years of education: Not on file   Highest education level: Not on file  Occupational History   Occupation: retired   Tobacco Use   Smoking status: Never   Smokeless tobacco: Never  Vaping Use   Vaping Use: Never used  Substance and Sexual Activity   Alcohol use: Yes    Alcohol/week: 1.0 standard drink of alcohol    Types: 1 Cans of beer per week   Drug use: No   Sexual activity: Yes    Birth control/protection: None  Other Topics Concern   Not on file  Social History Narrative   Son lives in [place]   Social Determinants of Health   Financial Resource Strain: Low Risk  (06/26/2022)   Overall Financial Resource Strain (CARDIA)    Difficulty of Paying Living Expenses: Not hard at all  Food Insecurity: No Food Insecurity (06/26/2022)   Hunger Vital Sign    Worried About Running Out of Food in the Last Year: Never true    Ran Out of Food in the Last Year: Never true  Transportation Needs: No Transportation Needs (06/26/2022)   PRAPARE - Administrator, Civil Service (Medical): No    Lack of Transportation (Non-Medical): No  Physical Activity: Sufficiently Active (06/26/2022)   Exercise Vital Sign    Days of Exercise per Week: 5 days    Minutes of Exercise per Session: 30 min  Stress: No Stress Concern Present (06/26/2022)   [employer]  of Occupational Health - Occupational Stress Questionnaire    Feeling of Stress : Not at all  Social Connections: Moderately Isolated (06/26/2022)   Social Connection and Isolation Panel [NHANES]    Frequency of Communication with Friends and Family: More than three times a week    Frequency of Social Gatherings with Friends and Family: More than three times a week    Attends Religious Services: Never    Database administrator or Organizations: No    Attends Engineer, structural: Never    Marital Status: Married    Tobacco Counseling Counseling given: Not Answered   Clinical Intake:  Pre-visit preparation completed: Yes  Pain : No/denies pain     BMI - recorded: 29.24 Nutritional Status: BMI 25 -29  Overweight Nutritional Risks: None Diabetes: No  How often do you need to have someone help you when you read instructions, pamphlets, or other written materials from your doctor or pharmacy?: 1 - Never  Diabetic? no  Interpreter Needed?: No  Information entered by :: Cecillia Menees, LPN   Activities of Daily Living    06/26/2022    9:05 AM  In your present state of health, do you have any difficulty performing the following activities:  Hearing? 0  Vision? 0  Difficulty concentrating or making decisions? 0  Walking or climbing stairs? 0  Dressing or bathing? 0  Doing errands, shopping? 0  Preparing Food and eating ? N  Using the Toilet? N  In the past six months, have you accidently leaked urine? N  Do you have problems with loss of bowel control? N  Managing your Medications? N  Managing your Finances? N  Housekeeping or managing your Housekeeping? N    Patient Care Team: Avanell Shackleton, NP-C as PCP - General (Family Medicine) Blima Ledger, OD (Optometry)  Indicate any recent Medical Services you may have received from other than [facility] providers in the past year (date may be approximate).     Assessment:   This is a routine wellness examination for [place].  Hearing/Vision screen Hearing Screening - Comments:: Denies hearing difficulties   Vision Screening - Comments:: Wears rx glasses - up to date with routine eye exams with Blima Ledger  Dietary issues and exercise activities discussed: Current Exercise Habits: Home exercise routine, Type of exercise: walking;Other - see comments (yard work, house work, Catering manager), Time (Minutes): 30, Frequency (Times/Week): 5, Weekly Exercise (Minutes/Week): 150, Intensity: Mild, Exercise limited by: None identified   Goals Addressed             This Visit's Progress    Patient Stated       Maintain independence - continue caring for father in law (who is in Hospice Care)       Depression Screen    06/26/2022    9:03 AM  12/31/2021    1:06 PM 08/14/2019    1:41 PM 06/07/2018   10:07 AM 06/07/2018   10:05 AM 05/06/2017    3:57 PM 06/30/2016    8:00 AM  PHQ 2/9 Scores  PHQ - 2 Score 0 0 0 0 0 0 0    Fall Risk    06/26/2022    9:01 AM 12/31/2021    1:06 PM 08/14/2019    1:41 PM 06/07/2018   10:07 AM 06/07/2018   10:05 AM  Fall Risk   Falls in the past year? 0 0 0 No No  Number falls in past yr: 0 0 0    Injury  with Fall? 0 0 0    Risk for fall due to : No Fall Risks No Fall Risks     Follow up Falls prevention discussed Falls evaluation completed Falls evaluation completed      [place]:  Any stairs in or around the home? No  If so, are there any without handrails? No  Home free of loose throw rugs in walkways, pet beds, electrical cords, etc? Yes  Adequate lighting in your home to reduce risk of falls? Yes   ASSISTIVE DEVICES UTILIZED TO PREVENT FALLS:  Life alert? No  Use of a cane, walker or w/c? No  Grab bars in the bathroom? No  Shower chair or bench in shower? No  Elevated toilet seat or a handicapped toilet? No   TIMED UP AND GO:  Was the test performed? No . Telephonic visit  Cognitive Function:        06/26/2022    9:07 AM 08/15/2020    2:58 PM  6CIT Screen  What Year? 0 points 0 points  What month? 0 points 0 points  What time? 0 points 0 points  Count back from 20 0 points 0 points  Months in reverse 0 points 0 points  Repeat phrase 0 points 0 points  Total Score 0 points 0 points    Immunizations Immunization History  Administered Date(s) Administered   Fluad Quad(high Dose 65+) 08/15/2020, 06/18/2021   Influenza,inj,Quad PF,6+ Mos 06/07/2018, 08/14/2019   Influenza-Unspecified 06/22/2022   Moderna Covid-19 Vaccine Bivalent Booster 3yrs & up 06/18/2021   Moderna Sars-Covid-2 Vaccination 07/31/2020, 02/19/2021   PFIZER(Purple Top)SARS-COV-2 Vaccination 01/05/2020, 01/29/2020   Pneumococcal Conjugate-13 08/15/2020   Pneumococcal  Polysaccharide-23 12/31/2021   Tdap 06/07/2018   Unspecified SARS-COV-2 Vaccination 06/22/2022   Zoster Recombinat (Shingrix) 06/11/2018, 09/27/2018    TDAP status: Up to date  Flu Vaccine status: Up to date  Pneumococcal vaccine status: Up to date  Covid-19 vaccine status: Completed vaccines  Qualifies for Shingles Vaccine? Yes   Zostavax completed Yes   Shingrix Completed?: Yes  Screening Tests Health Maintenance  Topic Date Due   COLONOSCOPY (Pts 45-39yrs Insurance coverage will need to be confirmed)  03/17/2028   TETANUS/TDAP  06/07/2028   Pneumonia Vaccine 48+ Years old  Completed   INFLUENZA VACCINE  Completed   COVID-19 Vaccine  Completed   Hepatitis C Screening  Completed   Zoster Vaccines- Shingrix  Completed   HPV VACCINES  Aged Out    Health Maintenance  There are no preventive care reminders to display for this patient.  Colorectal cancer screening: Type of screening: Colonoscopy. Completed 03/09/2013. Repeat every 10 years (need records)  Lung Cancer Screening: (Low Dose CT Chest recommended if Age 88-80 years, 30 pack-year currently smoking OR have quit w/in 15years.) does not qualify  Additional Screening:  Hepatitis C Screening: does qualify; Completed 06/07/2018  Vision Screening: Recommended annual ophthalmology exams for early detection of glaucoma and other disorders of the eye. Is the patient up to date with their annual eye exam?  Yes  Who is the provider or what is the name of the office in which the patient attends annual eye exams? [facility] If pt is not established with a provider, would they like to be referred to a provider to establish care? No .   Dental Screening: Recommended annual dental exams for proper oral hygiene  Community Resource Referral / Chronic Care Management: CRR required this visit?  No   CCM required  this visit?  No      Plan:     I have personally reviewed and noted the following in the patient's chart:    Medical and social history Use of alcohol, tobacco or illicit drugs  Current medications and supplements including opioid prescriptions. Patient is not currently taking opioid prescriptions. Functional ability and status Nutritional status Physical activity Advanced directives List of other physicians Hospitalizations, surgeries, and ER visits in previous 12 months Vitals Screenings to include cognitive, depression, and falls Referrals and appointments  In addition, I have reviewed and discussed with patient certain preventive protocols, quality metrics, and best practice recommendations. A written personalized care plan for preventive services as well as general preventive health recommendations were provided to patient.     [place] Constable, LPN   [id number]   Nurse Notes: None

## 2022-06-26 NOTE — Patient Instructions (Signed)
Leonard Holland , Thank you for taking time to come for your Medicare Wellness Visit. I appreciate your ongoing commitment to your health goals. Please review the following plan we discussed and let me know if I can assist you in the future.   These are the goals we discussed:  Goals      Patient Stated     Maintain independence - continue caring for father in law (who is in [place])        This is a list of the screening recommended for you and due dates:  Health Maintenance  Topic Date Due   Colon Cancer Screening  03/17/2028   Tetanus Vaccine  06/07/2028   Pneumonia Vaccine  Completed   Flu Shot  Completed   COVID-19 Vaccine  Completed   Hepatitis C Screening: USPSTF Recommendation to screen - Ages 18-79 yo.  Completed   Zoster (Shingles) Vaccine  Completed   HPV Vaccine  Aged Out    Advanced directives: Please bring a copy of your health care power of attorney and living will to the office to be added to your chart at your convenience.   Conditions/risks identified: Try to exercise more consistently. Aim for 30 minutes of exercise or brisk walking, 6-8 glasses of water, and 5 servings of fruits and vegetables each day.   Next appointment: Follow up in one year for your annual wellness visit.   Preventive Care 40 Years and Older, Male  Preventive care refers to lifestyle choices and visits with your health care provider that can promote health and wellness. What does preventive care include? A yearly physical exam. This is also called an annual well check. Dental exams once or twice a year. Routine eye exams. Ask your health care provider how often you should have your eyes checked. Personal lifestyle choices, including: Daily care of your teeth and gums. Regular physical activity. Eating a healthy diet. Avoiding tobacco and drug use. Limiting alcohol use. Practicing safe sex. Taking low doses of aspirin every day. Taking vitamin and mineral supplements as recommended  by your health care provider. What happens during an annual well check? The services and screenings done by your health care provider during your annual well check will depend on your age, overall health, lifestyle risk factors, and family history of disease. Counseling  Your health care provider may ask you questions about your: Alcohol use. Tobacco use. Drug use. Emotional well-being. Home and relationship well-being. Sexual activity. Eating habits. History of falls. Memory and ability to understand (cognition). Work and work Statistician. Screening  You may have the following tests or measurements: Height, weight, and BMI. Blood pressure. Lipid and cholesterol levels. These may be checked every 5 years, or more frequently if you are over 36 years old. Skin check. Lung cancer screening. You may have this screening every year starting at age 71 if you have a 30-pack-year history of smoking and currently smoke or have quit within the past 15 years. Fecal occult blood test (FOBT) of the stool. You may have this test every year starting at age 40. Flexible sigmoidoscopy or colonoscopy. You may have a sigmoidoscopy every 5 years or a colonoscopy every 10 years starting at age 76. Prostate cancer screening. Recommendations will vary depending on your family history and other risks. Hepatitis C blood test. Hepatitis B blood test. Sexually transmitted disease (STD) testing. Diabetes screening. This is done by checking your blood sugar (glucose) after you have not eaten for a while (fasting). You may have  this done every 1-3 years. Abdominal aortic aneurysm (AAA) screening. You may need this if you are a current or former smoker. Osteoporosis. You may be screened starting at age 43 if you are at high risk. Talk with your health care provider about your test results, treatment options, and if necessary, the need for more tests. Vaccines  Your health care provider may recommend certain  vaccines, such as: Influenza vaccine. This is recommended every year. Tetanus, diphtheria, and acellular pertussis (Tdap, Td) vaccine. You may need a Td booster every 10 years. Zoster vaccine. You may need this after age 75. Pneumococcal 13-valent conjugate (PCV13) vaccine. One dose is recommended after age 20. Pneumococcal polysaccharide (PPSV23) vaccine. One dose is recommended after age 88. Talk to your health care provider about which screenings and vaccines you need and how often you need them. This information is not intended to replace advice given to you by your health care provider. Make sure you discuss any questions you have with your health care provider. Document Released: 10/11/2015 Document Revised: 06/03/2016 Document Reviewed: 07/16/2015 Elsevier Interactive Patient Education  2017 [place] Prevention in the Home Falls can cause injuries. They can happen to people of all ages. There are many things you can do to make your home safe and to help prevent falls. What can I do on the outside of my home? Regularly fix the edges of walkways and driveways and fix any cracks. Remove anything that might make you trip as you walk through a door, such as a raised step or threshold. Trim any bushes or trees on the path to your home. Use bright outdoor lighting. Clear any walking paths of anything that might make someone trip, such as rocks or tools. Regularly check to see if handrails are loose or broken. Make sure that both sides of any steps have handrails. Any raised decks and porches should have guardrails on the edges. Have any leaves, snow, or ice cleared regularly. Use sand or salt on walking paths during winter. Clean up any spills in your garage right away. This includes oil or grease spills. What can I do in the bathroom? Use night lights. Install grab bars by the toilet and in the tub and shower. Do not use towel bars as grab bars. Use non-skid mats or decals in  the tub or shower. If you need to sit down in the shower, use a plastic, non-slip stool. Keep the floor dry. Clean up any water that spills on the floor as soon as it happens. Remove soap buildup in the tub or shower regularly. Attach bath mats securely with double-sided non-slip rug tape. Do not have throw rugs and other things on the floor that can make you trip. What can I do in the bedroom? Use night lights. Make sure that you have a light by your bed that is easy to reach. Do not use any sheets or blankets that are too big for your bed. They should not hang down onto the floor. Have a firm chair that has side arms. You can use this for support while you get dressed. Do not have throw rugs and other things on the floor that can make you trip. What can I do in the kitchen? Clean up any spills right away. Avoid walking on wet floors. Keep items that you use a lot in easy-to-reach places. If you need to reach something above you, use a strong step stool that has a grab bar. Keep electrical cords out  of the way. Do not use floor polish or wax that makes floors slippery. If you must use wax, use non-skid floor wax. Do not have throw rugs and other things on the floor that can make you trip. What can I do with my stairs? Do not leave any items on the stairs. Make sure that there are handrails on both sides of the stairs and use them. Fix handrails that are broken or loose. Make sure that handrails are as long as the stairways. Check any carpeting to make sure that it is firmly attached to the stairs. Fix any carpet that is loose or worn. Avoid having throw rugs at the top or bottom of the stairs. If you do have throw rugs, attach them to the floor with carpet tape. Make sure that you have a light switch at the top of the stairs and the bottom of the stairs. If you do not have them, ask someone to add them for you. What else can I do to help prevent falls? Wear shoes that: Do not have high  heels. Have rubber bottoms. Are comfortable and fit you well. Are closed at the toe. Do not wear sandals. If you use a stepladder: Make sure that it is fully opened. Do not climb a closed stepladder. Make sure that both sides of the stepladder are locked into place. Ask someone to hold it for you, if possible. Clearly mark and make sure that you can see: Any grab bars or handrails. First and last steps. Where the edge of each step is. Use tools that help you move around (mobility aids) if they are needed. These include: Canes. Walkers. Scooters. Crutches. Turn on the lights when you go into a dark area. Replace any light bulbs as soon as they burn out. Set up your furniture so you have a clear path. Avoid moving your furniture around. If any of your floors are uneven, fix them. If there are any pets around you, be aware of where they are. Review your medicines with your doctor. Some medicines can make you feel dizzy. This can increase your chance of falling. Ask your doctor what other things that you can do to help prevent falls. This information is not intended to replace advice given to you by your health care provider. Make sure you discuss any questions you have with your health care provider. Document Released: 07/11/2009 Document Revised: 02/20/2016 Document Reviewed: 10/19/2014 Elsevier Interactive Patient Education  2017 [employer].

## 2022-10-08 DIAGNOSIS — H43393 Other vitreous opacities, bilateral: Secondary | ICD-10-CM | POA: Diagnosis not present

## 2023-01-05 ENCOUNTER — Encounter: Payer: Medicare Other | Admitting: Family Medicine

## 2023-01-06 ENCOUNTER — Encounter: Payer: Self-pay | Admitting: Family Medicine

## 2023-01-06 ENCOUNTER — Ambulatory Visit (INDEPENDENT_AMBULATORY_CARE_PROVIDER_SITE_OTHER): Payer: Medicare Other | Admitting: Family Medicine

## 2023-01-06 VITALS — BP 120/74 | HR 81 | Temp 97.8°F | Ht 69.0 in | Wt 195.0 lb

## 2023-01-06 DIAGNOSIS — E781 Pure hyperglyceridemia: Secondary | ICD-10-CM | POA: Diagnosis not present

## 2023-01-06 DIAGNOSIS — I341 Nonrheumatic mitral (valve) prolapse: Secondary | ICD-10-CM | POA: Diagnosis not present

## 2023-01-06 DIAGNOSIS — Z0001 Encounter for general adult medical examination with abnormal findings: Secondary | ICD-10-CM

## 2023-01-06 DIAGNOSIS — J301 Allergic rhinitis due to pollen: Secondary | ICD-10-CM | POA: Diagnosis not present

## 2023-01-06 DIAGNOSIS — Z125 Encounter for screening for malignant neoplasm of prostate: Secondary | ICD-10-CM

## 2023-01-06 DIAGNOSIS — R011 Cardiac murmur, unspecified: Secondary | ICD-10-CM | POA: Diagnosis not present

## 2023-01-06 NOTE — Assessment & Plan Note (Signed)
Discussed nasal saline spray and rinses.

## 2023-01-06 NOTE — Assessment & Plan Note (Signed)
Return for fasting lipids. Continue low fat diet.

## 2023-01-06 NOTE — Assessment & Plan Note (Signed)
Preventive health care reviewed.  Due for colonoscopy this year. PSA ordered. Counseling on healthy lifestyle including diet and exercise.  Continue getting regular dental and eye exams.  Immunizations reviewed.  Discussed safety.

## 2023-01-06 NOTE — Patient Instructions (Addendum)
Please return for fasting labs tomorrow or Friday morning.   You will receive a call to schedule the echocardiogram.   Start slow and build up with exercise.   We will be in touch with your lab results.    Preventive Care 40 Years and Older, Male Preventive care refers to lifestyle choices and visits with your health care provider that can promote health and wellness. Preventive care visits are also called wellness exams. What can I expect for my preventive care visit? Counseling During your preventive care visit, your health care provider may ask about your: Medical history, including: Past medical problems. Family medical history. History of falls. Current health, including: Emotional well-being. Home life and relationship well-being. Sexual activity. Memory and ability to understand (cognition). Lifestyle, including: Alcohol, nicotine or tobacco, and drug use. Access to firearms. Diet, exercise, and sleep habits. Work and work Astronomer. Sunscreen use. Safety issues such as seatbelt and bike helmet use. Physical exam Your health care provider will check your: Height and weight. These may be used to calculate your BMI (body mass index). BMI is a measurement that tells if you are at a healthy weight. Waist circumference. This measures the distance around your waistline. This measurement also tells if you are at a healthy weight and may help predict your risk of certain diseases, such as type 2 diabetes and high blood pressure. Heart rate and blood pressure. Body temperature. Skin for abnormal spots. What immunizations do I need?  Vaccines are usually given at various ages, according to a schedule. Your health care provider will recommend vaccines for you based on your age, medical history, and lifestyle or other factors, such as travel or where you work. What tests do I need? Screening Your health care provider may recommend screening tests for certain conditions. This  may include: Lipid and cholesterol levels. Diabetes screening. This is done by checking your blood sugar (glucose) after you have not eaten for a while (fasting). Hepatitis C test. Hepatitis B test. HIV (human immunodeficiency virus) test. STI (sexually transmitted infection) testing, if you are at risk. Lung cancer screening. Colorectal cancer screening. Prostate cancer screening. Abdominal aortic aneurysm (AAA) screening. You may need this if you are a current or former smoker. Talk with your health care provider about your test results, treatment options, and if necessary, the need for more tests. Follow these instructions at home: Eating and drinking  Eat a diet that includes fresh fruits and vegetables, whole grains, lean protein, and low-fat dairy products. Limit your intake of foods with high amounts of sugar, saturated fats, and salt. Take vitamin and mineral supplements as recommended by your health care provider. Do not drink alcohol if your health care provider tells you not to drink. If you drink alcohol: Limit how much you have to 0-2 drinks a day. Know how much alcohol is in your drink. In the [place] one drink equals one 12 oz bottle of beer (355 mL), one 5 oz glass of wine (148 mL), or one 1 oz glass of hard liquor (44 mL). Lifestyle Brush your teeth every morning and night with fluoride toothpaste. Floss one time each day. Exercise for at least 30 minutes 5 or more days each week. Do not use any products that contain nicotine or tobacco. These products include cigarettes, chewing tobacco, and vaping devices, such as e-cigarettes. If you need help quitting, ask your health care provider. Do not use drugs. If you are sexually active, practice safe sex. Use a condom or other  form of protection to prevent STIs. Take aspirin only as told by your health care provider. Make sure that you understand how much to take and what form to take. Work with your health care provider to find  out whether it is safe and beneficial for you to take aspirin daily. Ask your health care provider if you need to take a cholesterol-lowering medicine (statin). Find healthy ways to manage stress, such as: Meditation, yoga, or listening to music. Journaling. Talking to a trusted person. Spending time with friends and family. Safety Always wear your seat belt while driving or riding in a vehicle. Do not drive: If you have been drinking alcohol. Do not ride with someone who has been drinking. When you are tired or distracted. While texting. If you have been using any mind-altering substances or drugs. Wear a helmet and other protective equipment during sports activities. If you have firearms in your house, make sure you follow all gun safety procedures. Minimize exposure to UV radiation to reduce your risk of skin cancer. What's next? Visit your health care provider once a year for an annual wellness visit. Ask your health care provider how often you should have your eyes and teeth checked. Stay up to date on all vaccines. This information is not intended to replace advice given to you by your health care provider. Make sure you discuss any questions you have with your health care provider. Document Revised: 03/12/2021 Document Reviewed: 03/12/2021 Elsevier Patient Education  [place].

## 2023-01-06 NOTE — Assessment & Plan Note (Signed)
No record of this. Last echocardiogram 15 years ago or longer.  Echo ordered today. He is planning to start exercise program.

## 2023-01-06 NOTE — Assessment & Plan Note (Addendum)
Long hx of murmur per patient. Echocardiogram ordered.  EKG shows NSR with rate 80. No LVH or Q waves. No ST-T wave abnormalities.

## 2023-01-06 NOTE — Progress Notes (Addendum)
Complete physical exam  Patient: Leonard Holland   DOB: Mar 02, 1956   67 y.o. Male  MRN: [id number]  Subjective:    Chief Complaint  Patient presents with   Annual Exam    Not fasting   He is new to the practice and here for a complete physical exam. AWV UTD.   Other providers: Eyes- Dr. Blima LedgerSally Miller  Dentist- Dr. Lynita LombardSimms in [place] [facility]- Dr. Loreta AveMann    His father in law passed since he was last year. He spent a lot of time helping care [place]   Gained 5 lbs.  Plans to start exercise routine. Joining a gym.  Healthier diet lately  Recent allergy symptoms. Prefers to treat them naturally.   Reports hx of MVP with murmur. Denies syncope. No dizziness, chest pain, palpitations, DOE or edema. States last echo 15 years ago or longer.   Health maintenance:   Colonoscopy: UTD. Recall later this year.  Last PSA: normal in 2023 Last Dental Exam: UTD  Last Eye Exam: UTD     Health Maintenance  Topic Date Due   COVID-19 Vaccine (7 - 2023-24 season) 01/22/2023*   Colon Cancer Screening  03/10/2023   Flu Shot  04/29/2023   Medicare Annual Wellness Visit  06/27/2023   DTaP/Tdap/Td vaccine (2 - Td or Tdap) 06/07/2028   Pneumonia Vaccine  Completed   Hepatitis C Screening: USPSTF Recommendation to screen - Ages 3218-79 yo.  Completed   Zoster (Shingles) Vaccine  Completed   HPV Vaccine  Aged Out  *Topic was postponed. The date shown is not the original due date.    Wears seatbelt always, uses sunscreen, smoke detectors in home and functioning, does not text while driving, feels safe in home environment.  Depression screening:    01/06/2023    8:02 AM 06/26/2022    9:03 AM 12/31/2021    1:06 PM  Depression screen PHQ 2/9  Decreased Interest 0 0 0  Down, Depressed, Hopeless 0 0 0  PHQ - 2 Score 0 0 0   Anxiety Screening:     No data to display          Vision:Within last year, Dental: No current dental problems and Receives regular dental  care, and PSA: Agrees to PSA testing  Patient Active Problem List   Diagnosis Date Noted   Seasonal allergic rhinitis due to pollen 01/06/2023   Heart murmur 01/06/2023   MVP (mitral valve prolapse) 01/06/2023   Hypertriglyceridemia 01/06/2023   Encounter for general adult medical examination with abnormal findings 01/06/2023   Past Medical History:  Diagnosis Date   Anxiety    Depression    Depression    Heart murmur    Migraine    Migraines    Mitral valve prolapse    [place] spotted fever    Past Surgical History:  Procedure Laterality Date   HERNIA REPAIR     VASECTOMY     WRIST SURGERY Right    tendon chief release   Social History   Tobacco Use   Smoking status: Never   Smokeless tobacco: Never  Vaping Use   Vaping Use: Never used  Substance Use Topics   Alcohol use: Yes    Alcohol/week: 1.0 standard drink of alcohol    Types: 1 Cans of beer per week   Drug use: No      Patient Care Team: Avanell ShackletonHenson, Antonetta Clanton L, NP-C as PCP - General (Family  Medicine) Blima Ledger, OD (Optometry) Charna Elizabeth, MD as Consulting Physician (Gastroenterology)   No outpatient medications prior to visit.   No facility-administered medications prior to visit.    Review of Systems  Constitutional:  Negative for chills, fever, malaise/fatigue and weight loss.  HENT:  Positive for congestion. Negative for ear pain, sinus pain and sore throat.   Eyes:  Negative for blurred vision, double vision and pain.  Respiratory:  Negative for cough, shortness of breath and wheezing.   Cardiovascular:  Negative for chest pain, palpitations and leg swelling.  Gastrointestinal:  Negative for abdominal pain, constipation, diarrhea, nausea and vomiting.  Genitourinary:  Negative for dysuria, frequency and urgency.  Musculoskeletal:  Negative for back pain, joint pain and myalgias.  Skin:  Negative for rash.  Neurological:  Negative for dizziness, tingling, focal weakness and headaches.   Endo/Heme/Allergies:  Does not bruise/bleed easily.  Psychiatric/Behavioral:  Negative for depression, memory loss and suicidal ideas. The patient is not nervous/anxious.        Objective:    BP 120/74 (BP Location: Left Arm, Patient Position: Sitting, Cuff Size: Large)   Pulse 81   Temp 97.8 F (36.6 C) (Temporal)   Ht 5\' 9"  (1.753 m)   Wt 195 lb (88.5 kg)   SpO2 98%   BMI 28.80 kg/m  BP Readings from Last 3 Encounters:  01/06/23 120/74  12/31/21 120/72  08/15/20 127/76   Wt Readings from Last 3 Encounters:  01/06/23 195 lb (88.5 kg)  06/26/22 198 lb (89.8 kg)  12/31/21 194 lb (88 kg)    Physical Exam Constitutional:      General: He is not in acute distress.    Appearance: He is not ill-appearing.  HENT:     Right Ear: Tympanic membrane, ear canal and external ear normal.     Left Ear: Tympanic membrane, ear canal and external ear normal.     Nose: Nose normal.     Mouth/Throat:     Mouth: Mucous membranes are moist.     Pharynx: Oropharynx is clear.  Eyes:     Extraocular Movements: Extraocular movements intact.     Conjunctiva/sclera: Conjunctivae normal.     Pupils: Pupils are equal, round, and reactive to light.  Neck:     Thyroid: No thyroid mass, thyromegaly or thyroid tenderness.     Vascular: No carotid bruit.  Cardiovascular:     Rate and Rhythm: Normal rate and regular rhythm.     Pulses: Normal pulses.     Heart sounds: Murmur heard.  Pulmonary:     Effort: Pulmonary effort is normal.     Breath sounds: Normal breath sounds.  Abdominal:     General: Bowel sounds are normal. There is no distension.     Palpations: Abdomen is soft.     Tenderness: There is no abdominal tenderness. There is no right CVA tenderness, left CVA tenderness, guarding or rebound.  Musculoskeletal:        General: Normal range of motion.     Cervical back: Normal range of motion and neck supple. No tenderness.     Right lower leg: No edema.     Left lower leg: No edema.   Lymphadenopathy:     Cervical: No cervical adenopathy.  Skin:    General: Skin is warm and dry.     Findings: No lesion or rash.  Neurological:     General: No focal deficit present.     Mental Status: He is alert and oriented  to person, place, and time.     Cranial Nerves: No cranial nerve deficit.     Sensory: No sensory deficit.     Motor: No weakness.  Psychiatric:        Mood and Affect: Mood normal.        Behavior: Behavior normal.        Thought Content: Thought content normal.      No results found for any visits on 01/06/23.    Assessment & Plan:    Routine Health Maintenance and Physical Exam  Problem List Items Addressed This Visit       Cardiovascular and Mediastinum   MVP (mitral valve prolapse)    No record of this. Last echocardiogram 15 years ago or longer.  Echo ordered today. He is planning to start exercise program.       Relevant Orders   ECHOCARDIOGRAM COMPLETE   EKG 12-Lead     Respiratory   Seasonal allergic rhinitis due to pollen    Discussed nasal saline spray and rinses.         Other   Encounter for general adult medical examination with abnormal findings - Primary    Preventive health care reviewed.  Due for colonoscopy this year. PSA ordered. Counseling on healthy lifestyle including diet and exercise.  Continue getting regular dental and eye exams.  Immunizations reviewed.  Discussed safety.       Relevant Orders   Urinalysis, Routine w reflex microscopic   Heart murmur    Long hx of murmur per patient. Echocardiogram ordered.  EKG shows NSR with rate 80. No LVH or Q waves. No ST-T wave abnormalities.       Relevant Orders   ECHOCARDIOGRAM COMPLETE   CBC with Differential/Platelet   Comprehensive metabolic panel   TSH   EKG 12-Lead   Hypertriglyceridemia    Return for fasting lipids. Continue low fat diet.       Relevant Orders   Lipid panel   Other Visit Diagnoses     Screening for prostate cancer       Relevant  Orders   PSA, Medicare       Return in about 1 year (around 01/06/2024).     Hetty Blend, NP-C

## 2023-01-07 LAB — COMPREHENSIVE METABOLIC PANEL
ALT: 21 U/L (ref 0–53)
AST: 13 U/L (ref 0–37)
Albumin: 4.3 g/dL (ref 3.5–5.2)
Alkaline Phosphatase: 59 U/L (ref 39–117)
BUN: 20 mg/dL (ref 6–23)
CO2: 23 mEq/L (ref 19–32)
Calcium: 9.7 mg/dL (ref 8.4–10.5)
Chloride: 108 mEq/L (ref 96–112)
Creatinine, Ser: 1.26 mg/dL (ref 0.40–1.50)
GFR: 58.92 mL/min — ABNORMAL LOW (ref >60.00)
Glucose, Bld: 96 mg/dL (ref 70–99)
Potassium: 4.8 mEq/L (ref 3.5–5.1)
Sodium: 140 mEq/L (ref 135–145)
Total Bilirubin: 0.5 mg/dL (ref 0.2–1.2)
Total Protein: 6.6 g/dL (ref 6.0–8.3)

## 2023-01-07 LAB — URINALYSIS, ROUTINE W REFLEX MICROSCOPIC
Bilirubin Urine: NEGATIVE
Hgb urine dipstick: NEGATIVE
Ketones, ur: NEGATIVE
Leukocytes,Ua: NEGATIVE
Nitrite: NEGATIVE
RBC / HPF: NONE SEEN (ref 0–?)
Specific Gravity, Urine: 1.025 (ref 1.000–1.030)
Total Protein, Urine: NEGATIVE
Urine Glucose: NEGATIVE
Urobilinogen, UA: 0.2 (ref 0.0–1.0)
pH: 5.5 (ref 5.0–8.0)

## 2023-01-07 LAB — CBC WITH DIFFERENTIAL/PLATELET
Basophils Absolute: 0.1 10*3/uL (ref 0.0–0.1)
Basophils Relative: 1 % (ref 0.0–3.0)
Eosinophils Absolute: 0.3 10*3/uL (ref 0.0–0.7)
Eosinophils Relative: 5.8 % — ABNORMAL HIGH (ref 0.0–5.0)
HCT: 50.2 % (ref 39.0–52.0)
Hemoglobin: 16.7 g/dL (ref 13.0–17.0)
Lymphocytes Relative: 24.2 % (ref 12.0–46.0)
Lymphs Abs: 1.4 10*3/uL (ref 0.7–4.0)
MCHC: 33.3 g/dL (ref 30.0–36.0)
MCV: 80.5 fl (ref 78.0–100.0)
Monocytes Absolute: 0.5 10*3/uL (ref 0.1–1.0)
Monocytes Relative: 7.6 % (ref 3.0–12.0)
Neutro Abs: 3.7 10*3/uL (ref 1.4–7.7)
Neutrophils Relative %: 61.4 % (ref 43.0–77.0)
Platelets: 264 10*3/uL (ref 150.0–400.0)
RBC: 6.23 Mil/uL — ABNORMAL HIGH (ref 4.22–5.81)
RDW: 14.1 % (ref 11.5–15.5)
WBC: 6 10*3/uL (ref 4.0–10.5)

## 2023-01-07 LAB — PSA, MEDICARE: PSA: 1.67 ng/ml (ref 0.10–4.00)

## 2023-01-07 LAB — LIPID PANEL
Cholesterol: 127 mg/dL (ref 0–200)
HDL: 48.2 mg/dL (ref >39.00)
LDL Cholesterol: 66 mg/dL (ref 0–99)
NonHDL: 79.01
Total CHOL/HDL Ratio: 3
Triglycerides: 65 mg/dL (ref 0.0–149.0)
VLDL: 13 mg/dL (ref 0.0–40.0)

## 2023-01-07 LAB — TSH: TSH: 1.51 u[IU]/mL (ref 0.35–5.50)

## 2023-01-08 ENCOUNTER — Encounter: Payer: Self-pay | Admitting: Family Medicine

## 2023-01-08 NOTE — Telephone Encounter (Signed)
Any concerns with pt's urine results?

## 2023-02-02 DIAGNOSIS — Z1211 Encounter for screening for malignant neoplasm of colon: Secondary | ICD-10-CM | POA: Diagnosis not present

## 2023-02-03 ENCOUNTER — Ambulatory Visit (HOSPITAL_COMMUNITY): Payer: Medicare Other | Attending: Cardiology

## 2023-02-03 DIAGNOSIS — I341 Nonrheumatic mitral (valve) prolapse: Secondary | ICD-10-CM | POA: Diagnosis not present

## 2023-02-03 DIAGNOSIS — R011 Cardiac murmur, unspecified: Secondary | ICD-10-CM | POA: Diagnosis not present

## 2023-02-03 LAB — ECHOCARDIOGRAM COMPLETE
Area-P 1/2: 3.08 cm2
S' Lateral: 2.4 cm

## 2023-02-17 DIAGNOSIS — D235 Other benign neoplasm of skin of trunk: Secondary | ICD-10-CM | POA: Diagnosis not present

## 2023-02-17 DIAGNOSIS — D225 Melanocytic nevi of trunk: Secondary | ICD-10-CM | POA: Diagnosis not present

## 2023-02-17 DIAGNOSIS — L821 Other seborrheic keratosis: Secondary | ICD-10-CM | POA: Diagnosis not present

## 2023-02-17 DIAGNOSIS — L814 Other melanin hyperpigmentation: Secondary | ICD-10-CM | POA: Diagnosis not present

## 2023-02-17 DIAGNOSIS — D492 Neoplasm of unspecified behavior of bone, soft tissue, and skin: Secondary | ICD-10-CM | POA: Diagnosis not present

## 2023-03-18 DIAGNOSIS — D492 Neoplasm of unspecified behavior of bone, soft tissue, and skin: Secondary | ICD-10-CM | POA: Diagnosis not present

## 2023-03-18 DIAGNOSIS — D171 Benign lipomatous neoplasm of skin and subcutaneous tissue of trunk: Secondary | ICD-10-CM | POA: Diagnosis not present

## 2023-04-12 DIAGNOSIS — Z1211 Encounter for screening for malignant neoplasm of colon: Secondary | ICD-10-CM | POA: Diagnosis not present

## 2023-04-12 DIAGNOSIS — K648 Other hemorrhoids: Secondary | ICD-10-CM | POA: Diagnosis not present

## 2023-04-12 LAB — HM COLONOSCOPY

## 2023-05-13 ENCOUNTER — Encounter (INDEPENDENT_AMBULATORY_CARE_PROVIDER_SITE_OTHER): Payer: Self-pay

## 2023-05-25 ENCOUNTER — Encounter: Payer: Self-pay | Admitting: Family Medicine

## 2023-06-28 ENCOUNTER — Ambulatory Visit (INDEPENDENT_AMBULATORY_CARE_PROVIDER_SITE_OTHER): Payer: Medicare Other

## 2023-06-28 VITALS — Ht 69.0 in | Wt 192.0 lb

## 2023-06-28 DIAGNOSIS — Z0000 Encounter for general adult medical examination without abnormal findings: Secondary | ICD-10-CM | POA: Diagnosis not present

## 2023-06-28 NOTE — Progress Notes (Addendum)
Subjective:   Leonard Holland is a 67 y.o. male who presents for Medicare Annual/Subsequent preventive examination.  Visit Complete: Virtual  I connected with  [name] on 06/28/23 by a audio enabled telemedicine application and verified that I am speaking with the correct person using two identifiers.  Patient Location: Home  Provider Location: Office/Clinic  I discussed the limitations of evaluation and management by telemedicine. The patient expressed understanding and agreed to proceed.  Because this visit was a virtual/telehealth visit, some criteria may be missing or patient reported. Any vitals not documented were not able to be obtained and vitals that have been documented are patient reported.    Cardiac Risk Factors include: advanced age (>21men, >66 women);male gender     Objective:    Today's Vitals   06/28/23 0847  Weight: 192 lb (87.1 kg)  Height: 5\' 9"  (1.753 m)  PainSc: 0-No pain   Body mass index is 28.35 kg/m.     06/28/2023    8:49 AM 06/26/2022    9:05 AM 08/15/2020    2:54 PM  Advanced Directives  Does Patient Have a Medical Advance Directive? Yes Yes Yes  Type of Estate agent of [place];Living will Healthcare Power of [place];Living will   Does patient want to make changes to medical advance directive?   Yes (ED - Information included in AVS)  Copy of Healthcare Power of Attorney in Chart? No - copy requested No - copy requested     Current Medications (verified) No outpatient encounter medications on file as of 06/28/2023.   No facility-administered encounter medications on file as of 06/28/2023.    Allergies (verified) Penicillins   History: Past Medical History:  Diagnosis Date   Anxiety    Depression    Depression    Heart murmur    Migraine    Migraines    Mitral valve prolapse    Rocky [place] spotted fever    Past Surgical History:  Procedure Laterality Date   HERNIA REPAIR     VASECTOMY     WRIST  SURGERY Right    tendon chief release   Family History  Problem Relation Age of Onset   Cancer Father    Social History   Socioeconomic History   Marital status: Married    Spouse name: Maralyn Sago   Number of children: 1   Years of education: Not on file   Highest education level: Not on file  Occupational History   Occupation: retired  Tobacco Use   Smoking status: Never   Smokeless tobacco: Never  Vaping Use   Vaping status: Never Used  Substance and Sexual Activity   Alcohol use: Yes    Alcohol/week: 1.0 standard drink of alcohol    Types: 1 Cans of beer per week   Drug use: No   Sexual activity: Yes    Birth control/protection: None  Other Topics Concern   Not on file  Social History Narrative   Son lives in [place]   Social Determinants of Health   Financial Resource Strain: Low Risk  (06/28/2023)   Overall Financial Resource Strain (CARDIA)    Difficulty of Paying Living Expenses: Not hard at all  Food Insecurity: No Food Insecurity (06/28/2023)   Hunger Vital Sign    Worried About Running Out of Food in the Last Year: Never true    Ran Out of Food in the Last Year: Never true  Transportation Needs: No Transportation Needs (06/28/2023)   PRAPARE -  Administrator, Civil Service (Medical): No    Lack of Transportation (Non-Medical): No  Physical Activity: Sufficiently Active (06/28/2023)   Exercise Vital Sign    Days of Exercise per Week: 5 days    Minutes of Exercise per Session: 30 min  Stress: No Stress Concern Present (06/28/2023)   [employer] of Occupational Health - Occupational Stress Questionnaire    Feeling of Stress : Not at all  Social Connections: Moderately Isolated (06/26/2022)   Social Connection and Isolation Panel [NHANES]    Frequency of Communication with Friends and Family: More than three times a week    Frequency of Social Gatherings with Friends and Family: More than three times a week    Attends Religious Services: Never     Database administrator or Organizations: No    Attends Engineer, structural: Never    Marital Status: Married    Tobacco Counseling Counseling given: Not Answered   Clinical Intake:  Pre-visit preparation completed: Yes  Pain : No/denies pain Pain Score: 0-No pain     BMI - recorded: 28.35 Nutritional Status: BMI 25 -29 Overweight Nutritional Risks: None Diabetes: No  How often do you need to have someone help you when you read instructions, pamphlets, or other written materials from your doctor or pharmacy?: 1 - Never What is the last grade level you completed in school?: 2 YEARS OF COLLEGE  Interpreter Needed?: No  Information entered by :: Francoise Chojnowski N. Jerad Dunlap, LPN.   Activities of Daily Living    06/28/2023    8:50 AM  In your present state of health, do you have any difficulty performing the following activities:  Hearing? 0  Vision? 0  Difficulty concentrating or making decisions? 0  Walking or climbing stairs? 0  Dressing or bathing? 0  Doing errands, shopping? 0  Preparing Food and eating ? N  Using the Toilet? N  In the past six months, have you accidently leaked urine? N  Do you have problems with loss of bowel control? N  Managing your Medications? N  Managing your Finances? N  Housekeeping or managing your Housekeeping? N    Patient Care Team: Avanell Shackleton, NP-C as PCP - General (Family Medicine) Blima Ledger, OD (Optometry) Charna Elizabeth, MD as Consulting Physician (Gastroenterology)  Indicate any recent Medical Services you may have received from other than [facility] providers in the past year (date may be approximate).     Assessment:   This is a routine wellness examination for Leonard Holland.  Hearing/Vision screen Hearing Screening - Comments:: Denies hearing difficulties.  Vision Screening - Comments:: Wears rx glasses - up to date with routine eye exams with Blima Ledger, OD.    Goals Addressed             This Visit's  Progress    : To maintain my health by staying healthy and no falls.        Depression Screen    06/28/2023    8:51 AM 01/06/2023    8:02 AM 06/26/2022    9:03 AM 12/31/2021    1:06 PM 08/14/2019    1:41 PM 06/07/2018   10:07 AM 06/07/2018   10:05 AM  PHQ 2/9 Scores  PHQ - 2 Score 0 0 0 0 0 0 0  PHQ- 9 Score 0          Fall Risk    06/28/2023    8:49 AM 01/06/2023    8:02 AM 06/26/2022  9:01 AM 12/31/2021    1:06 PM 08/14/2019    1:41 PM  Fall Risk   Falls in the past year? 0 0 0 0 0  Number falls in past yr: 0 0 0 0 0  Injury with Fall? 0 0 0 0 0  Risk for fall due to : No Fall Risks No Fall Risks No Fall Risks No Fall Risks   Follow up Falls prevention discussed Falls evaluation completed Falls prevention discussed Falls evaluation completed Falls evaluation completed    MEDICARE RISK AT HOME: Medicare Risk at Home Any stairs in or around the home?: No If so, are there any without handrails?: No Home free of loose throw rugs in walkways, pet beds, electrical cords, etc?: Yes Adequate lighting in your home to reduce risk of falls?: Yes Life alert?: No Use of a cane, walker or w/c?: No Grab bars in the bathroom?: No Shower chair or bench in shower?: No Elevated toilet seat or a handicapped toilet?: No  TIMED UP AND GO:  Was the test performed?  No    Cognitive Function:        06/28/2023    8:50 AM 06/26/2022    9:07 AM 08/15/2020    2:58 PM  6CIT Screen  What Year? 0 points 0 points 0 points  What month? 0 points 0 points 0 points  What time? 0 points 0 points 0 points  Count back from 20 0 points 0 points 0 points  Months in reverse 0 points 0 points 0 points  Repeat phrase 0 points 2 points 0 points  Total Score 0 points 2 points 0 points    Immunizations Immunization History  Administered Date(s) Administered   Fluad Quad(high Dose 65+) 08/15/2020, 06/18/2021   Influenza,inj,Quad PF,6+ Mos 06/07/2018, 08/14/2019   Influenza-Unspecified 06/22/2022    Moderna Covid-19 Vaccine Bivalent Booster 3yrs & up 06/18/2021   Moderna Sars-Covid-2 Vaccination 07/31/2020, 02/19/2021   PFIZER(Purple Top)SARS-COV-2 Vaccination 01/05/2020, 01/29/2020   Pneumococcal Conjugate-13 08/15/2020   Pneumococcal Polysaccharide-23 12/31/2021   Tdap 06/07/2018   Unspecified SARS-COV-2 Vaccination 06/22/2022   Zoster Recombinant(Shingrix) 06/11/2018, 09/27/2018    TDAP status: Up to date  Flu Vaccine status: Due, Education has been provided regarding the importance of this vaccine. Advised may receive this vaccine at local pharmacy or Health Dept. Aware to provide a copy of the vaccination record if obtained from local pharmacy or Health Dept. Verbalized acceptance and understanding.  Pneumococcal vaccine status: Up to date  Covid-19 vaccine status: Information provided on how to obtain vaccines.   Qualifies for Shingles Vaccine? Yes   Zostavax completed No   Shingrix Completed?: Yes  Screening Tests Health Maintenance  Topic Date Due   Colonoscopy  03/10/2023   INFLUENZA VACCINE  04/29/2023   COVID-19 Vaccine (7 - 2023-24 season) 05/30/2023   Medicare Annual Wellness (AWV)  06/27/2024   DTaP/Tdap/Td (2 - Td or Tdap) 06/07/2028   Pneumonia Vaccine 49+ Years old  Completed   Hepatitis C Screening  Completed   Zoster Vaccines- Shingrix  Completed   HPV VACCINES  Aged Out    Health Maintenance  Health Maintenance Due  Topic Date Due   Colonoscopy  03/10/2023   INFLUENZA VACCINE  04/29/2023   COVID-19 Vaccine (7 - 2023-24 season) 05/30/2023    Colorectal cancer screening: Type of screening: Colonoscopy. Completed 04/12/2023. Repeat every 10 years (Confirmed with Arty Baumgartner, MD office)  Lung Cancer Screening: (Low Dose CT Chest recommended if Age 40-80 years, 20 pack-year currently  smoking OR have quit w/in 15years.) does not qualify.   Lung Cancer Screening Referral: no  Additional Screening:  Hepatitis C Screening: does qualify; Completed  06/07/2018  Vision Screening: Recommended annual ophthalmology exams for early detection of glaucoma and other disorders of the eye. Is the patient up to date with their annual eye exam?  Yes  Who is the provider or what is the name of the office in which the patient attends annual eye exams? Blima Ledger, O D. If pt is not established with a provider, would they like to be referred to a provider to establish care? No .   Dental Screening: Recommended annual dental exams for proper oral hygiene  Diabetic Foot Exam: N/A  Community Resource Referral / Chronic Care Management: CRR required this visit?  No   CCM required this visit?  No     Plan:     I have personally reviewed and noted the following in the patient's chart:   Medical and social history Use of alcohol, tobacco or illicit drugs  Current medications and supplements including opioid prescriptions. Patient is not currently taking opioid prescriptions. Functional ability and status Nutritional status Physical activity Advanced directives List of other physicians Hospitalizations, surgeries, and ER visits in previous 12 months Vitals Screenings to include cognitive, depression, and falls Referrals and appointments  In addition, I have reviewed and discussed with patient certain preventive protocols, quality metrics, and best practice recommendations. A written personalized care plan for preventive services as well as general preventive health recommendations were provided to patient.     Mickeal Needy, LPN   0/[id number]/[id number]   After Visit Summary: (MyChart) Due to this being a telephonic visit, the after visit summary with patients personalized plan was offered to patient via MyChart   Nurse Notes: Normal cognitive status assessed by direct observation via telephone conversation by this Nurse Health Advisor. No abnormalities found.

## 2023-06-28 NOTE — Patient Instructions (Signed)
Leonard Holland , Thank you for taking time to come for your Medicare Wellness Visit. I appreciate your ongoing commitment to your health goals. Please review the following plan we discussed and let me know if I can assist you in the future.   Referrals/Orders/Follow-Ups/Clinician Recommendations: No  This is a list of the screening recommended for you and due dates:  Health Maintenance  Topic Date Due   Colon Cancer Screening  03/10/2023   Flu Shot  04/29/2023   COVID-19 Vaccine (7 - 2023-24 season) 05/30/2023   Medicare Annual Wellness Visit  06/27/2024   DTaP/Tdap/Td vaccine (2 - Td or Tdap) 06/07/2028   Pneumonia Vaccine  Completed   Hepatitis C Screening  Completed   Zoster (Shingles) Vaccine  Completed   HPV Vaccine  Aged Out    Advanced directives: (Copy Requested) Please bring a copy of your health care power of attorney and living will to the office to be added to your chart at your convenience.  Next Medicare Annual Wellness Visit scheduled for next year: Yes

## 2023-07-12 ENCOUNTER — Encounter: Payer: Self-pay | Admitting: Family Medicine

## 2023-07-13 NOTE — Telephone Encounter (Signed)
Do you recommend rsv vaccine?

## 2023-09-30 DIAGNOSIS — D225 Melanocytic nevi of trunk: Secondary | ICD-10-CM | POA: Diagnosis not present

## 2023-09-30 DIAGNOSIS — L821 Other seborrheic keratosis: Secondary | ICD-10-CM | POA: Diagnosis not present

## 2023-09-30 DIAGNOSIS — D235 Other benign neoplasm of skin of trunk: Secondary | ICD-10-CM | POA: Diagnosis not present

## 2023-09-30 DIAGNOSIS — L538 Other specified erythematous conditions: Secondary | ICD-10-CM | POA: Diagnosis not present

## 2023-09-30 DIAGNOSIS — D492 Neoplasm of unspecified behavior of bone, soft tissue, and skin: Secondary | ICD-10-CM | POA: Diagnosis not present

## 2023-09-30 DIAGNOSIS — L814 Other melanin hyperpigmentation: Secondary | ICD-10-CM | POA: Diagnosis not present

## 2023-12-22 DIAGNOSIS — G44009 Cluster headache syndrome, unspecified, not intractable: Secondary | ICD-10-CM | POA: Diagnosis not present

## 2023-12-22 DIAGNOSIS — H53143 Visual discomfort, bilateral: Secondary | ICD-10-CM | POA: Diagnosis not present

## 2023-12-22 DIAGNOSIS — H43813 Vitreous degeneration, bilateral: Secondary | ICD-10-CM | POA: Diagnosis not present

## 2023-12-22 DIAGNOSIS — H353111 Nonexudative age-related macular degeneration, right eye, early dry stage: Secondary | ICD-10-CM | POA: Diagnosis not present

## 2023-12-22 DIAGNOSIS — H43393 Other vitreous opacities, bilateral: Secondary | ICD-10-CM | POA: Diagnosis not present

## 2023-12-22 DIAGNOSIS — H25813 Combined forms of age-related cataract, bilateral: Secondary | ICD-10-CM | POA: Diagnosis not present

## 2024-04-28 ENCOUNTER — Encounter: Payer: Self-pay | Admitting: Family Medicine

## 2024-05-11 ENCOUNTER — Telehealth: Payer: Self-pay

## 2024-05-11 ENCOUNTER — Other Ambulatory Visit (HOSPITAL_COMMUNITY): Payer: Self-pay

## 2024-05-11 ENCOUNTER — Ambulatory Visit: Payer: Self-pay | Admitting: Family Medicine

## 2024-05-11 ENCOUNTER — Encounter: Payer: Self-pay | Admitting: Family Medicine

## 2024-05-11 ENCOUNTER — Ambulatory Visit (INDEPENDENT_AMBULATORY_CARE_PROVIDER_SITE_OTHER): Admitting: Family Medicine

## 2024-05-11 VITALS — BP 114/72 | HR 88 | Temp 97.9°F | Ht 69.0 in | Wt 192.0 lb

## 2024-05-11 DIAGNOSIS — I517 Cardiomegaly: Secondary | ICD-10-CM

## 2024-05-11 DIAGNOSIS — N1831 Chronic kidney disease, stage 3a: Secondary | ICD-10-CM

## 2024-05-11 DIAGNOSIS — M6281 Muscle weakness (generalized): Secondary | ICD-10-CM

## 2024-05-11 DIAGNOSIS — E781 Pure hyperglyceridemia: Secondary | ICD-10-CM

## 2024-05-11 DIAGNOSIS — N529 Male erectile dysfunction, unspecified: Secondary | ICD-10-CM

## 2024-05-11 DIAGNOSIS — N289 Disorder of kidney and ureter, unspecified: Secondary | ICD-10-CM | POA: Diagnosis not present

## 2024-05-11 DIAGNOSIS — N400 Enlarged prostate without lower urinary tract symptoms: Secondary | ICD-10-CM | POA: Insufficient documentation

## 2024-05-11 LAB — CBC WITH DIFFERENTIAL/PLATELET
Basophils Absolute: 0.1 K/uL (ref 0.0–0.1)
Basophils Relative: 1.3 % (ref 0.0–3.0)
Eosinophils Absolute: 0.2 K/uL (ref 0.0–0.7)
Eosinophils Relative: 3.3 % (ref 0.0–5.0)
HCT: 48.6 % (ref 39.0–52.0)
Hemoglobin: 15.8 g/dL (ref 13.0–17.0)
Lymphocytes Relative: 20.4 % (ref 12.0–46.0)
Lymphs Abs: 1.2 K/uL (ref 0.7–4.0)
MCHC: 32.6 g/dL (ref 30.0–36.0)
MCV: 81.3 fl (ref 78.0–100.0)
Monocytes Absolute: 0.5 K/uL (ref 0.1–1.0)
Monocytes Relative: 8 % (ref 3.0–12.0)
Neutro Abs: 3.9 K/uL (ref 1.4–7.7)
Neutrophils Relative %: 67 % (ref 43.0–77.0)
Platelets: 266 K/uL (ref 150.0–400.0)
RBC: 5.97 Mil/uL — ABNORMAL HIGH (ref 4.22–5.81)
RDW: 14.1 % (ref 11.5–15.5)
WBC: 5.8 K/uL (ref 4.0–10.5)

## 2024-05-11 LAB — LIPID PANEL
Cholesterol: 122 mg/dL (ref 0–200)
HDL: 49.8 mg/dL (ref >39.00)
LDL Cholesterol: 58 mg/dL (ref 0–99)
NonHDL: 72.39
Total CHOL/HDL Ratio: 2
Triglycerides: 72 mg/dL (ref 0.0–149.0)
VLDL: 14.4 mg/dL (ref 0.0–40.0)

## 2024-05-11 LAB — COMPREHENSIVE METABOLIC PANEL WITH GFR
ALT: 23 U/L (ref 0–53)
AST: 17 U/L (ref 0–37)
Albumin: 4.4 g/dL (ref 3.5–5.2)
Alkaline Phosphatase: 56 U/L (ref 39–117)
BUN: 20 mg/dL (ref 6–23)
CO2: 27 meq/L (ref 19–32)
Calcium: 9.8 mg/dL (ref 8.4–10.5)
Chloride: 106 meq/L (ref 96–112)
Creatinine, Ser: 1.36 mg/dL (ref 0.40–1.50)
GFR: 53.26 mL/min — ABNORMAL LOW (ref >60.00)
Glucose, Bld: 89 mg/dL (ref 70–99)
Potassium: 4.8 meq/L (ref 3.5–5.1)
Sodium: 140 meq/L (ref 135–145)
Total Bilirubin: 0.7 mg/dL (ref 0.2–1.2)
Total Protein: 6.8 g/dL (ref 6.0–8.3)

## 2024-05-11 LAB — PSA, MEDICARE: PSA: 2.82 ng/mL (ref 0.10–4.00)

## 2024-05-11 LAB — TSH: TSH: 1.1 u[IU]/mL (ref 0.35–5.50)

## 2024-05-11 LAB — LUTEINIZING HORMONE: LH: 3.24 m[IU]/mL

## 2024-05-11 LAB — HEMOGLOBIN A1C: Hgb A1c MFr Bld: 6.1 % (ref 4.6–6.5)

## 2024-05-11 MED ORDER — TADALAFIL 5 MG PO TABS: 5.0000 mg | ORAL_TABLET | Freq: Every day | ORAL | 3 refills | Status: DC

## 2024-05-11 NOTE — Patient Instructions (Signed)
 Please go downstairs for labs before you leave.  I will be in touch with your results and with recommendations.

## 2024-05-11 NOTE — Progress Notes (Addendum)
 Subjective:     Patient ID: Leonard Holland, male    DOB: 1956/01/19, 68 y.o.   MRN: [id number]  Chief Complaint  Patient presents with   Medication Management    Discuss ED medication or testosterone .     HPI  Discussed the use of AI scribe software for clinical note transcription with the patient, who gave verbal consent to proceed.  History of Present Illness Leonard Holland is a 68 year old who presents with concerns regarding erectile dysfunction.  Erectile dysfunction - Gradual onset over the past six months - Characterized by achieving only partial erections - Libido remains intact - No medications have been tried for this issue  Cardiovascular history - History of heart murmur - Previous EKG and echocardiogram showed mild left ventricular hypertrophy and grade 1 diastolic dysfunction  - No current cardiovascular medications - Cholesterol levels are controlled  Renal function - Aware of decreased kidney function  Associated symptoms and general health - No headaches, chest pain, palpitations, or urinary issues - No significant weight changes - Remains active without joint pain      Health Maintenance Due  Topic Date Due   COVID-19 Vaccine (8 - 2024-25 season) 01/04/2024   INFLUENZA VACCINE  04/28/2024   Medicare Annual Wellness (AWV)  06/27/2024    Past Medical History:  Diagnosis Date   Anxiety    Depression    Depression    Heart murmur    Migraine    Migraines    Mitral valve prolapse    Rocky Mountain spotted fever     Past Surgical History:  Procedure Laterality Date   HERNIA REPAIR     VASECTOMY     WRIST SURGERY Right    tendon chief release    Family History  Problem Relation Age of Onset   Cancer Father     Social History   Socioeconomic History   Marital status: Married    Spouse name: Lauraine   Number of children: 1   Years of education: Not on file   Highest education level: Associate degree: academic program  Occupational  History   Occupation: retired  Tobacco Use   Smoking status: Never   Smokeless tobacco: Never  Vaping Use   Vaping status: Never Used  Substance and Sexual Activity   Alcohol use: Yes    Alcohol/week: 1.0 standard drink of alcohol    Types: 1 Cans of beer per week   Drug use: No   Sexual activity: Yes    Birth control/protection: None  Other Topics Concern   Not on file  Social History Narrative   Son lives in [place]   Social Drivers of Health   Financial Resource Strain: Low Risk  (05/11/2024)   Overall Financial Resource Strain (CARDIA)    Difficulty of Paying Living Expenses: Not hard at all  Food Insecurity: No Food Insecurity (05/11/2024)   Hunger Vital Sign    Worried About Running Out of Food in the Last Year: Never true    Ran Out of Food in the Last Year: Never true  Transportation Needs: No Transportation Needs (05/11/2024)   PRAPARE - Administrator, Civil Service (Medical): No    Lack of Transportation (Non-Medical): No  Physical Activity: Insufficiently Active (05/11/2024)   Exercise Vital Sign    Days of Exercise per Week: 2 days    Minutes of Exercise per Session: 20 min  Stress: No Stress Concern Present (05/11/2024)   [employer] of  Occupational Health - Occupational Stress Questionnaire    Feeling of Stress: Only a little  Social Connections: Moderately Integrated (05/11/2024)   Social Connection and Isolation Panel    Frequency of Communication with Friends and Family: More than three times a week    Frequency of Social Gatherings with Friends and Family: Once a week    Attends Religious Services: More than 4 times per year    Active Member of [employer] or Organizations: No    Attends Banker Meetings: Not on file    Marital Status: Married  Catering manager Violence: Not At Risk (06/28/2023)   Humiliation, Afraid, Rape, and Kick questionnaire    Fear of Current or Ex-Partner: No    Emotionally Abused: No    Physically  Abused: No    Sexually Abused: No    No outpatient medications prior to visit.   No facility-administered medications prior to visit.    Allergies  Allergen Reactions   Penicillins Other (See Comments)    convulsions    ROS Per HPI    Objective:    Physical Exam Constitutional:      General: He is not in acute distress.    Appearance: He is not ill-appearing.  HENT:     Mouth/Throat:     Mouth: Mucous membranes are moist.     Pharynx: Oropharynx is clear.  Eyes:     Extraocular Movements: Extraocular movements intact.     Conjunctiva/sclera: Conjunctivae normal.  Cardiovascular:     Rate and Rhythm: Normal rate and regular rhythm.  Pulmonary:     Effort: Pulmonary effort is normal.     Breath sounds: Normal breath sounds.  Musculoskeletal:        General: Normal range of motion.     Cervical back: Normal range of motion and neck supple.     Right lower leg: No edema.     Left lower leg: No edema.  Skin:    General: Skin is warm and dry.  Neurological:     General: No focal deficit present.     Mental Status: He is alert and oriented to person, place, and time.     Cranial Nerves: No cranial nerve deficit.     Motor: No weakness.     Coordination: Coordination normal.     Gait: Gait normal.  Psychiatric:        Mood and Affect: Mood normal.        Behavior: Behavior normal.        Thought Content: Thought content normal.      BP 114/72   Pulse 88   Temp 97.9 F (36.6 C) (Temporal)   Ht 5' 9 (1.753 m)   Wt 192 lb (87.1 kg)   SpO2 97%   BMI 28.35 kg/m  Wt Readings from Last 3 Encounters:  05/11/24 192 lb (87.1 kg)  06/28/23 192 lb (87.1 kg)  01/06/23 195 lb (88.5 kg)       Assessment & Plan:   Problem List Items Addressed This Visit     Hypertriglyceridemia   Relevant Medications   tadalafil  (CIALIS ) 5 MG tablet   Other Relevant Orders   Hemoglobin A1c (Completed)   Lipid panel (Completed)   Other Visit Diagnoses       Erectile  dysfunction, unspecified erectile dysfunction type    -  Primary   Relevant Orders   EKG 12-Lead (Completed)   Hemoglobin A1c (Completed)   TSH (Completed)   PSA, Medicare (Completed)  Prolactin   LH (Completed)   Testosterone  , Free and Total     Decreased renal function       Relevant Orders   CBC with Differential/Platelet (Completed)   Comprehensive metabolic panel with GFR (Completed)   Hemoglobin A1c (Completed)     Decreased muscle strength       Relevant Orders   Testosterone  , Free and Total     Mild concentric left ventricular hypertrophy (LVH)       Relevant Medications   tadalafil  (CIALIS ) 5 MG tablet   Other Relevant Orders   EKG 12-Lead (Completed)     Benign prostatic hyperplasia, unspecified whether lower urinary tract symptoms present          Assessment and Plan Assessment & Plan Erectile dysfunction Gradual onset over six months with decreased ability to maintain an erection. Libido remains intact. No prior medication use. Erectile dysfunction may indicate ischemic heart disease despite no significant coronary artery disease. Plan to evaluate renal function and cardiovascular status before treatment. - Order blood work to assess kidney function and testosterone  levels. - Perform EKG to evaluate cardiac status. - Prescribe medication for erectile dysfunction if low risk for ischemic heart disease. -BPH suspected due to recent increase in PSA. Cialis  5 mg daily prescribed.  - Discuss potential side effects of medication - Refer to urology if medication is ineffective or testosterone  levels are low.  Impaired renal function Previous labs indicated decreased kidney function. No current medication affecting renal function. - Order blood work to reassess kidney function.  Left ventricular hypertrophy and heart murmur Mild left ventricular hypertrophy and heart murmur with previous echocardiogram showing no significant coronary artery disease. - Perform EKG to  assess current cardiac status.  EKG shows NSR, rate 75, no ischemic pattern     I am having Lynwood RAMAN. Finck start on tadalafil .  Meds ordered this encounter  Medications   tadalafil  (CIALIS ) 5 MG tablet    Sig: Take 1 tablet (5 mg total) by mouth daily.    Dispense:  30 tablet    Refill:  3    Supervising Provider:   ROLLENE NORRIS A [[id number]]

## 2024-05-11 NOTE — Progress Notes (Signed)
 Please call Leonard Holland and let him know that his PSA blood test is slightly elevated compared to last year.  Since this is suspicious for BPH (benign prostate enlargement) I am prescribing Cialis  5 mg daily.  This is also for ED and insurance should cover it. Also, his kidney function is still stage III chronic kidney disease.  Stay well-hydrated and avoid over-the-counter NSAIDs such as Aleve, ibuprofen  or Advil . I did a blood test to screen for diabetes and he has prediabetes.  I recommend cutting back on sugar and carbohydrates and increasing physical activity to help lower his blood sugars.

## 2024-05-11 NOTE — Addendum Note (Signed)
 Addended by: Gerica Koble L on: 05/11/2024 03:44 PM   Modules accepted: Orders

## 2024-05-11 NOTE — Telephone Encounter (Signed)
 Pharmacy Patient Advocate Encounter   Received notification from CoverMyMeds that prior authorization for Tadalafil  5MG  tablets is required/requested.   Insurance verification completed.   The patient is insured through [facility] .   Per test claim: PA required; PA submitted to above mentioned insurance via Latent Key/confirmation #[id number] Status is pending

## 2024-05-11 NOTE — Assessment & Plan Note (Signed)
 Recent increase in PSA over the past year suggestive of BPH. Cialis  5 mg daily prescribed

## 2024-05-12 ENCOUNTER — Telehealth: Payer: Self-pay | Admitting: Pharmacist

## 2024-05-12 NOTE — Telephone Encounter (Signed)
 PA was DENIED due to:  not meeting the prior authorization requirement(s). You have a trial and failure, contraindication, or intolerance to two covered alpha blocker drugs (for example: tamsulosin, alfuzosin).  **Leonard Holland would like this appealed as these medications would NOT treat both conditions, ED and BPH as Tadalafil  5MG  will, which is why she prescribed. Please submit PA Appeal. Thanks!

## 2024-05-12 NOTE — Telephone Encounter (Signed)
 An Appeal has been submitted for Tadalafil . Will advise when response is received, please be advised that most companies may take 30 days to make a decision. Appeal letter and supporting documentation have been faxed to [phone] on 05/12/2024 @11 :25 am.  Thank you, Devere Pandy, PharmD Clinical Pharmacist  [place]  Direct Dial: [phone]

## 2024-05-15 ENCOUNTER — Encounter: Payer: Self-pay | Admitting: Family Medicine

## 2024-05-15 DIAGNOSIS — E781 Pure hyperglyceridemia: Secondary | ICD-10-CM

## 2024-05-16 ENCOUNTER — Ambulatory Visit

## 2024-05-18 LAB — TESTOSTERONE, FREE & TOTAL
Free Testosterone: 72.4 pg/mL (ref 35.0–155.0)
Testosterone, Total, LC-MS-MS: 569 ng/dL (ref 250–1100)

## 2024-05-18 LAB — PROLACTIN: Prolactin: 11.1 ng/mL (ref 2.0–18.0)

## 2024-05-22 ENCOUNTER — Other Ambulatory Visit: Payer: Self-pay | Admitting: Family Medicine

## 2024-05-22 DIAGNOSIS — N1831 Chronic kidney disease, stage 3a: Secondary | ICD-10-CM | POA: Insufficient documentation

## 2024-05-22 DIAGNOSIS — R7303 Prediabetes: Secondary | ICD-10-CM

## 2024-05-22 MED ORDER — DAPAGLIFLOZIN PROPANEDIOL 10 MG PO TABS: 10.0000 mg | ORAL_TABLET | Freq: Every day | ORAL | 2 refills | Status: DC

## 2024-05-24 ENCOUNTER — Other Ambulatory Visit (HOSPITAL_COMMUNITY): Payer: Self-pay

## 2024-05-24 NOTE — Telephone Encounter (Signed)
 Received letter from insurance that they have decided to APPROVE the medication appeal

## 2024-06-28 ENCOUNTER — Ambulatory Visit: Payer: Medicare Other

## 2024-07-04 ENCOUNTER — Encounter: Payer: Self-pay | Admitting: Family Medicine

## 2024-07-04 DIAGNOSIS — R7303 Prediabetes: Secondary | ICD-10-CM

## 2024-07-04 DIAGNOSIS — E781 Pure hyperglyceridemia: Secondary | ICD-10-CM

## 2024-07-12 ENCOUNTER — Encounter: Payer: Self-pay | Admitting: Family Medicine

## 2024-07-17 ENCOUNTER — Encounter: Payer: Self-pay | Admitting: Family Medicine

## 2024-08-04 ENCOUNTER — Ambulatory Visit

## 2024-09-12 ENCOUNTER — Ambulatory Visit: Admitting: Family Medicine

## 2024-09-12 ENCOUNTER — Ambulatory Visit: Payer: Self-pay | Admitting: Family Medicine

## 2024-09-12 VITALS — BP 124/76 | HR 75 | Temp 97.9°F | Ht 69.0 in | Wt 187.0 lb

## 2024-09-12 DIAGNOSIS — N529 Male erectile dysfunction, unspecified: Secondary | ICD-10-CM | POA: Diagnosis not present

## 2024-09-12 DIAGNOSIS — R7303 Prediabetes: Secondary | ICD-10-CM | POA: Diagnosis not present

## 2024-09-12 DIAGNOSIS — R011 Cardiac murmur, unspecified: Secondary | ICD-10-CM

## 2024-09-12 DIAGNOSIS — N1831 Chronic kidney disease, stage 3a: Secondary | ICD-10-CM

## 2024-09-12 LAB — COMPREHENSIVE METABOLIC PANEL WITH GFR
ALT: 19 U/L (ref 0–53)
AST: 16 U/L (ref 5–37)
Albumin: 4.2 g/dL (ref 3.5–5.2)
Alkaline Phosphatase: 58 U/L (ref 39–117)
BUN: 16 mg/dL (ref 6–23)
CO2: 28 meq/L (ref 19–32)
Calcium: 9.7 mg/dL (ref 8.4–10.5)
Chloride: 106 meq/L (ref 96–112)
Creatinine, Ser: 1.24 mg/dL (ref 0.40–1.50)
GFR: 59.36 mL/min — ABNORMAL LOW (ref >60.00)
Glucose, Bld: 96 mg/dL (ref 70–99)
Potassium: 4.3 meq/L (ref 3.5–5.1)
Sodium: 140 meq/L (ref 135–145)
Total Bilirubin: 0.4 mg/dL (ref 0.2–1.2)
Total Protein: 6.8 g/dL (ref 6.0–8.3)

## 2024-09-12 LAB — CBC WITH DIFFERENTIAL/PLATELET
Basophils Absolute: 0.1 K/uL (ref 0.0–0.1)
Basophils Relative: 1.1 % (ref 0.0–3.0)
Eosinophils Absolute: 0.3 K/uL (ref 0.0–0.7)
Eosinophils Relative: 6.6 % — ABNORMAL HIGH (ref 0.0–5.0)
HCT: 48.2 % (ref 39.0–52.0)
Hemoglobin: 15.8 g/dL (ref 13.0–17.0)
Lymphocytes Relative: 21.7 % (ref 12.0–46.0)
Lymphs Abs: 1.1 K/uL (ref 0.7–4.0)
MCHC: 32.8 g/dL (ref 30.0–36.0)
MCV: 80.8 fl (ref 78.0–100.0)
Monocytes Absolute: 0.5 K/uL (ref 0.1–1.0)
Monocytes Relative: 8.9 % (ref 3.0–12.0)
Neutro Abs: 3.2 K/uL (ref 1.4–7.7)
Neutrophils Relative %: 61.7 % (ref 43.0–77.0)
Platelets: 243 K/uL (ref 150.0–400.0)
RBC: 5.97 Mil/uL — ABNORMAL HIGH (ref 4.22–5.81)
RDW: 14.7 % (ref 11.5–15.5)
WBC: 5.1 K/uL (ref 4.0–10.5)

## 2024-09-12 LAB — HEMOGLOBIN A1C: Hgb A1c MFr Bld: 5.7 % (ref 4.6–6.5)

## 2024-09-12 MED ORDER — TADALAFIL 5 MG PO TABS: 5.0000 mg | ORAL_TABLET | Freq: Every day | ORAL | 3 refills | Status: AC

## 2024-09-12 MED ORDER — LISINOPRIL 2.5 MG PO TABS: 2.5000 mg | ORAL_TABLET | Freq: Every day | ORAL | 1 refills | Status: AC

## 2024-09-12 NOTE — Patient Instructions (Signed)
 Please go downstairs for labs.   Start lisinopril  2.5 mg daily for your chronic kidney disease please.

## 2024-09-12 NOTE — Progress Notes (Signed)
 Subjective:     Patient ID: Leonard Holland, male    DOB: 1956/05/06, 68 y.o.   MRN: [id number]  Chief Complaint  Patient presents with   Medical Management of Chronic Issues    4 month f/u    HPI  Discussed the use of AI scribe software for clinical note transcription with the patient, who gave verbal consent to proceed.  History of Present Illness Leonard Holland is a 68 year old male with chronic kidney disease stage three and prediabetes who presents for follow-up on chronic health conditions.  Chronic kidney disease and general health status - Chronic kidney disease stage 3. - No current medication side effects or swelling. - Feeling better overall. - Weight loss attributed to dietary changes and increased physical activity. - Farxiga  prescribed but not affordable and did not start it   Lifestyle modifications - Working with a systems analyst for six weeks, focusing on flexibility and avoiding overexertion. - Reduced soda intake from three to four per day to one per day, substituting tea. - Eating healthier and maintaining hydration. - Interested in affordable dietary information since medical nutritionist I referred him to was not affordable   Erectile dysfunction management - Uses Cialis  with good effect. - Experiences night sweats if Cialis  is missed. - Requests refill of Cialis .     Health Maintenance Due  Topic Date Due   Medicare Annual Wellness (AWV)  06/27/2024    Past Medical History:  Diagnosis Date   Anxiety    Depression    Depression    Heart murmur    Migraine    Migraines    Mitral valve prolapse    [place] spotted fever     Past Surgical History:  Procedure Laterality Date   HERNIA REPAIR     VASECTOMY     WRIST SURGERY Right    tendon chief release    Family History  Problem Relation Age of Onset   Cancer Father     Social History   Socioeconomic History   Marital status: Married    Spouse name: Lauraine   Number of  children: 1   Years of education: Not on file   Highest education level: Associate degree: occupational, scientist, product/process development, or vocational program  Occupational History   Occupation: retired  Tobacco Use   Smoking status: Never   Smokeless tobacco: Never  Vaping Use   Vaping status: Never Used  Substance and Sexual Activity   Alcohol use: Yes    Alcohol/week: 1.0 standard drink of alcohol    Types: 1 Cans of beer per week   Drug use: No   Sexual activity: Yes    Birth control/protection: None  Other Topics Concern   Not on file  Social History Narrative   Son lives in [place]   Social Drivers of Health   Tobacco Use: Low Risk (09/12/2024)   Patient History    Smoking Tobacco Use: Never    Smokeless Tobacco Use: Never    Passive Exposure: Not on file  Financial Resource Strain: Low Risk (09/12/2024)   Overall Financial Resource Strain (CARDIA)    Difficulty of Paying Living Expenses: Not hard at all  Food Insecurity: No Food Insecurity (09/12/2024)   Epic    Worried About Radiation Protection Practitioner of Food in the Last Year: Never true    Ran Out of Food in the Last Year: Never true  Transportation Needs: No Transportation Needs (09/12/2024)   Epic    Lack of Transportation (  Medical): No    Lack of Transportation (Non-Medical): No  Physical Activity: Insufficiently Active (09/12/2024)   Exercise Vital Sign    Days of Exercise per Week: 2 days    Minutes of Exercise per Session: 20 min  Stress: No Stress Concern Present (09/12/2024)   [employer] of Occupational Health - Occupational Stress Questionnaire    Feeling of Stress: Not at all  Social Connections: Socially Integrated (09/12/2024)   Social Connection and Isolation Panel    Frequency of Communication with Friends and Family: More than three times a week    Frequency of Social Gatherings with Friends and Family: Once a week    Attends Religious Services: More than 4 times per year    Active Member of [employer] or Organizations:  Yes    Attends Banker Meetings: 1 to 4 times per year    Marital Status: Married  Catering Manager Violence: Not At Risk (06/28/2023)   Humiliation, Afraid, Rape, and Kick questionnaire    Fear of Current or Ex-Partner: No    Emotionally Abused: No    Physically Abused: No    Sexually Abused: No  Depression (PHQ2-9): Low Risk (09/12/2024)   Depression (PHQ2-9)    PHQ-2 Score: 0  Alcohol Screen: Low Risk (09/12/2024)   Alcohol Screen    Last Alcohol Screening Score (AUDIT): 2  Housing: Low Risk (09/12/2024)   Epic    Unable to Pay for Housing in the Last Year: No    Number of Times Moved in the Last Year: 0    Homeless in the Last Year: No  Utilities: Not At Risk (06/28/2023)   [facility] Utilities    Threatened with loss of utilities: No  Health Literacy: Adequate Health Literacy (06/28/2023)   B1300 Health Literacy    Frequency of need for help with medical instructions: Never    Outpatient Medications Prior to Visit  Medication Sig Dispense Refill   tadalafil  (CIALIS ) 5 MG tablet Take 1 tablet (5 mg total) by mouth daily. 30 tablet 3   dapagliflozin  propanediol (FARXIGA ) 10 MG TABS tablet Take 1 tablet (10 mg total) by mouth daily before breakfast. (Patient not taking: Reported on 09/12/2024) 30 tablet 2   No facility-administered medications prior to visit.    Allergies[1]  Review of Systems  Constitutional:  Positive for weight loss. Negative for chills, fever and malaise/fatigue.       Intentional  Respiratory:  Negative for shortness of breath.   Cardiovascular:  Negative for chest pain, palpitations and leg swelling.  Gastrointestinal:  Negative for abdominal pain, constipation, diarrhea, nausea and vomiting.  Genitourinary:  Negative for dysuria, frequency and urgency.  Neurological:  Negative for dizziness, focal weakness and headaches.  Psychiatric/Behavioral:  Negative for depression. The patient is not nervous/anxious.        Objective:    Physical  Exam Constitutional:      General: He is not in acute distress.    Appearance: He is not ill-appearing.  HENT:     Mouth/Throat:     Mouth: Mucous membranes are moist.     Pharynx: Oropharynx is clear.  Eyes:     Extraocular Movements: Extraocular movements intact.     Conjunctiva/sclera: Conjunctivae normal.     Pupils: Pupils are equal, round, and reactive to light.  Cardiovascular:     Rate and Rhythm: Normal rate and regular rhythm.  Pulmonary:     Effort: Pulmonary effort is normal.     Breath sounds: Normal breath sounds.  Musculoskeletal:     Cervical back: Normal range of motion and neck supple.     Right lower leg: No edema.     Left lower leg: No edema.  Skin:    General: Skin is warm and dry.  Neurological:     General: No focal deficit present.     Mental Status: He is alert and oriented to person, place, and time.     Motor: No weakness.     Coordination: Coordination normal.     Gait: Gait normal.  Psychiatric:        Mood and Affect: Mood normal.        Behavior: Behavior normal.        Thought Content: Thought content normal.      BP 124/76   Pulse 75   Temp 97.9 F (36.6 C) (Temporal)   Ht 5' 9 (1.753 m)   Wt 187 lb (84.8 kg)   SpO2 98%   BMI 27.62 kg/m  Wt Readings from Last 3 Encounters:  09/12/24 187 lb (84.8 kg)  05/11/24 192 lb (87.1 kg)  06/28/23 192 lb (87.1 kg)       Assessment & Plan:   Problem List Items Addressed This Visit     Erectile dysfunction   Heart murmur   Stage 3a chronic kidney disease (HCC) - Primary   Relevant Orders   Comprehensive metabolic panel with GFR   Other Visit Diagnoses       Prediabetes       Relevant Orders   CBC with Differential/Platelet   Comprehensive metabolic panel with GFR   Hemoglobin A1c       Assessment and Plan Assessment & Plan Stage III chronic kidney disease Chronic kidney disease stage III with previous consideration of Farxiga , which was discontinued due to cost. Current  management includes lifestyle modifications such as weight loss and dietary changes. Lisinopril  is considered for renal protection without antihypertensive effects. - Discontinued Farxiga  due to cost. - Prescribed low-dose lisinopril  for renal protection. - Ordered blood work to check kidney function.  Erectile dysfunction Managed with Cialis  5 mg daily. Reports improvement in symptoms with Cialis , with night sweats occurring when doses are missed. No other side effects reported. - Refilled Cialis  5 mg oral daily.  Prediabetes Previous A1c indicating prediabetes. Recent lifestyle changes include reduced soda intake and increased physical activity, potentially improving glycemic control. - Ordered A1c to assess current glycemic control.  General health maintenance Discussion of Medicare wellness visit and its benefits. He declined the visit, preferring not to participate in the program. - Declined Medicare wellness visit.     I have discontinued Birt S. Ysaguirre's dapagliflozin  propanediol. I am also having him start on lisinopril . Additionally, I am having him maintain his tadalafil .  Meds ordered this encounter  Medications   lisinopril  (ZESTRIL ) 2.5 MG tablet    Sig: Take 1 tablet (2.5 mg total) by mouth daily.    Dispense:  90 tablet    Refill:  1    Supervising Provider:   ROLLENE NORRIS A [[id number]]   tadalafil  (CIALIS ) 5 MG tablet    Sig: Take 1 tablet (5 mg total) by mouth daily.    Dispense:  30 tablet    Refill:  3    Supervising Provider:   ROLLENE NORRIS A [[id number]]       [1]  Allergies Allergen Reactions   Penicillins Other (See Comments)    convulsions
